# Patient Record
Sex: Female | Born: 1970
Health system: Southern US, Community
[De-identification: ages and names within clinical notes are randomized; demographics above are authoritative.]

## PROBLEM LIST (undated history)

## (undated) DIAGNOSIS — D649 Anemia, unspecified: Secondary | ICD-10-CM

## (undated) DIAGNOSIS — D582 Other hemoglobinopathies: Secondary | ICD-10-CM

## (undated) DIAGNOSIS — R768 Other specified abnormal immunological findings in serum: Secondary | ICD-10-CM

## (undated) HISTORY — DX: Other specified abnormal immunological findings in serum: R76.8

## (undated) HISTORY — PX: BREAST CYST ASPIRATION: SHX578

## (undated) HISTORY — PX: NASAL ENDOSCOPY: SHX286

## (undated) HISTORY — DX: Other hemoglobinopathies: D58.2

## (undated) HISTORY — DX: Anemia, unspecified: D64.9

---

## 2014-05-06 ENCOUNTER — Ambulatory Visit: Payer: Self-pay | Admitting: General Practice

## 2014-12-24 ENCOUNTER — Encounter: Payer: Self-pay | Admitting: Family Medicine

## 2014-12-24 ENCOUNTER — Ambulatory Visit (INDEPENDENT_AMBULATORY_CARE_PROVIDER_SITE_OTHER): Payer: 59 | Admitting: Family Medicine

## 2014-12-24 ENCOUNTER — Telehealth: Payer: Self-pay | Admitting: Family Medicine

## 2014-12-24 VITALS — BP 112/66 | HR 99 | Temp 97.7°F | Resp 16 | Ht 61.0 in | Wt 126.9 lb

## 2014-12-24 DIAGNOSIS — Z136 Encounter for screening for cardiovascular disorders: Secondary | ICD-10-CM

## 2014-12-24 DIAGNOSIS — Z124 Encounter for screening for malignant neoplasm of cervix: Secondary | ICD-10-CM | POA: Insufficient documentation

## 2014-12-24 DIAGNOSIS — N644 Mastodynia: Secondary | ICD-10-CM | POA: Insufficient documentation

## 2014-12-24 DIAGNOSIS — IMO0001 Reserved for inherently not codable concepts without codable children: Secondary | ICD-10-CM

## 2014-12-24 DIAGNOSIS — Z8719 Personal history of other diseases of the digestive system: Secondary | ICD-10-CM | POA: Insufficient documentation

## 2014-12-24 DIAGNOSIS — R5383 Other fatigue: Secondary | ICD-10-CM | POA: Diagnosis not present

## 2014-12-24 DIAGNOSIS — Z01419 Encounter for gynecological examination (general) (routine) without abnormal findings: Secondary | ICD-10-CM | POA: Insufficient documentation

## 2014-12-24 DIAGNOSIS — Z113 Encounter for screening for infections with a predominantly sexual mode of transmission: Secondary | ICD-10-CM | POA: Diagnosis not present

## 2014-12-24 DIAGNOSIS — Z23 Encounter for immunization: Secondary | ICD-10-CM | POA: Insufficient documentation

## 2014-12-24 DIAGNOSIS — Z13220 Encounter for screening for lipoid disorders: Secondary | ICD-10-CM | POA: Diagnosis not present

## 2014-12-24 DIAGNOSIS — Z0000 Encounter for general adult medical examination without abnormal findings: Secondary | ICD-10-CM

## 2014-12-24 DIAGNOSIS — Z1239 Encounter for other screening for malignant neoplasm of breast: Secondary | ICD-10-CM | POA: Insufficient documentation

## 2014-12-24 DIAGNOSIS — Z8768 Personal history of other (corrected) conditions arising in the perinatal period: Secondary | ICD-10-CM | POA: Insufficient documentation

## 2014-12-24 DIAGNOSIS — M1990 Unspecified osteoarthritis, unspecified site: Secondary | ICD-10-CM | POA: Insufficient documentation

## 2014-12-24 DIAGNOSIS — Z8669 Personal history of other diseases of the nervous system and sense organs: Secondary | ICD-10-CM | POA: Insufficient documentation

## 2014-12-24 DIAGNOSIS — M79609 Pain in unspecified limb: Secondary | ICD-10-CM | POA: Insufficient documentation

## 2014-12-24 DIAGNOSIS — Z87898 Personal history of other specified conditions: Secondary | ICD-10-CM | POA: Insufficient documentation

## 2014-12-24 DIAGNOSIS — N503 Cyst of epididymis: Secondary | ICD-10-CM | POA: Insufficient documentation

## 2014-12-24 DIAGNOSIS — Z789 Other specified health status: Secondary | ICD-10-CM | POA: Insufficient documentation

## 2014-12-24 NOTE — Telephone Encounter (Signed)
Contacted the [facility] and they will fax the report to out office.

## 2014-12-24 NOTE — Progress Notes (Signed)
Name: Bailey Perez   MRN: [id number]    DOB: Feb 03, 1971   Date:12/24/2014       Progress Note  Subjective  Chief Complaint  Chief Complaint  Patient presents with  . Annual Exam  . Labs Only    HPI  Patient is here today for a Complete Female Physical Exam:  The patient has has no unusual complaints, occassionally feels fatigued. Overall feels healthy. Diet is well balanced. In general does exercise regularly. Sees dentist regularly and addresses vision concerns with ophthalmologist if applicable. In regards to sexual activity the patient is currently sexually active. Currently is concerned about exposure to any STDs. Menstrual history is regular periods every 29 days. Pap test done 12/23/2013 negative.   Last year she consulted with me regarding years of bilateral breast pain. She did some diagnostic breast studies January 2016 and cysts were identified however results are not found in EPIC or [employer]. She reports that the bilateral breast pain was worse Fall and Winter months last year but improved this year so she had not contacted me regarding the results of her breast studies. She has changed her diet around, drinking more herbal tea and taking herbal remedies which have helped.    Past Medical History  Diagnosis Date  . Allergy     Past Surgical History  Procedure Laterality Date  . Nasal endoscopy      Dr. Clyde Canterbury    History reviewed. No pertinent family history.  Social History   Social History  . Marital Status: Married    Spouse Name: N/A  . Number of Children: N/A  . Years of Education: N/A   Occupational History  . Not on file.   Social History Main Topics  . Smoking status: Never Smoker   . Smokeless tobacco: Not on file  . Alcohol Use: No  . Drug Use: No  . Sexual Activity: No   Other Topics Concern  . Not on file   Social History Narrative  . No narrative on file     Current outpatient prescriptions:  .  Echinacea 400 MG CAPS,  Take by mouth., Disp: , Rfl:   No Known Allergies  ROS  CONSTITUTIONAL: No significant weight changes, fever, chills, weakness or fatigue.  HEENT:  - Eyes: No visual changes.  - Ears: No auditory changes. No pain.  - Nose: No sneezing, congestion, runny nose. - Throat: No sore throat. No changes in swallowing. SKIN: No rash or itching.  CARDIOVASCULAR: No chest pain, chest pressure or chest discomfort. No palpitations or edema.  RESPIRATORY: No shortness of breath, cough or sputum.  GASTROINTESTINAL: No anorexia, nausea, vomiting. No changes in bowel habits. No abdominal pain or blood.  GENITOURINARY: No dysuria. No frequency. No discharge. Yes breast pain both sides intermittently. NEUROLOGICAL: No headache, dizziness, syncope, paralysis, ataxia, numbness or tingling in the extremities. No memory changes. No change in bowel or bladder control.  MUSCULOSKELETAL: No joint pain. No muscle pain. HEMATOLOGIC: No anemia, bleeding or bruising.  LYMPHATICS: No enlarged lymph nodes.  PSYCHIATRIC: No change in mood. No change in sleep pattern.  ENDOCRINOLOGIC: No reports of sweating, cold or heat intolerance. No polyuria or polydipsia.   Objective  Filed Vitals:   12/24/14 1435  BP: 112/66  Pulse: 99  Temp: 97.7 F (36.5 C)  TempSrc: Oral  Resp: 16  Height: 5\' 1"  (1.549 m)  Weight: 126 lb 14.4 oz (57.561 kg)  SpO2: 98%   Body mass index is 23.99 kg/(m^2).  Depression screen PHQ 2/9 12/24/2014  Decreased Interest 0  Down, Depressed, Hopeless 0  PHQ - 2 Score 0     Physical Exam  Constitutional: Patient appears well-developed and well-nourished. In no distress.  HEENT:  - Head: Normocephalic and atraumatic.  - Ears: Bilateral TMs gray, no erythema or effusion - Nose: Nasal mucosa moist - Mouth/Throat: Oropharynx is clear and moist. No tonsillar hypertrophy or erythema. No post nasal drainage.  - Eyes: Conjunctivae clear, EOM movements normal. PERRLA. No scleral icterus.   Neck: Normal range of motion. Neck supple. No JVD present. No thyromegaly present.  Cardiovascular: Normal rate, regular rhythm and normal heart sounds.  No murmur heard.  Pulmonary/Chest: Effort normal and breath sounds normal. No respiratory distress. Abdominal: Soft. Bowel sounds are normal, no distension. There is no tenderness. no masses BREAST: Bilateral breast exam with no skin changes or nipple discharge. Palpable mass border of nipple right breast at the 9 o'clock position.  FEMALE GENITALIA: Deffered Musculoskeletal: Normal range of motion bilateral UE and LE, no joint effusions. Peripheral vascular: Bilateral LE no edema. Neurological: CN II-XII grossly intact with no focal deficits. Alert and oriented to person, place, and time. Coordination, balance, strength, speech and gait are normal.  Skin: Skin is warm and dry. No rash noted. No erythema.  Psychiatric: Patient has a normal mood and affect. Behavior is normal in office today. Judgment and thought content normal in office today.   Assessment & Plan  1. Annual physical exam Healthy female. Will be getting flu shot through work.  2. Breast pain in female We will be reaching out to the breast center to get her imaging records from earlier this year and repeat imaging accordingly.  - CBC with Differential/Platelet - Comprehensive metabolic panel - TSH  3. Encounter for cholesteral screening for cardiovascular disease  - Lipid panel  4. Other fatigue  - CBC with Differential/Platelet - Comprehensive metabolic panel - TSH  5. Screening for STD (sexually transmitted disease)  - Urine GC/Chlamydia/Trich

## 2014-12-24 NOTE — Telephone Encounter (Signed)
Please contact [facility] for breast imaging results done 04/2014 and get me a copy to review so I can order further testing.

## 2014-12-25 ENCOUNTER — Telehealth: Payer: Self-pay | Admitting: Family Medicine

## 2014-12-25 ENCOUNTER — Other Ambulatory Visit: Payer: Self-pay | Admitting: Family Medicine

## 2014-12-25 NOTE — Telephone Encounter (Signed)
I tried to contact this patient to review the results from her mammogram, but there was no answer. A message was left for the patient to give [place] a call when she got the chance. '

## 2014-12-25 NOTE — Telephone Encounter (Signed)
Please let patient know that I got a copy of her mammogram and ultrasound from 05/06/14 and the findings show:  simple appearing cyst in the right breast 10 o'clock location, no evidence for malignancy in either breast.  Once a year mammogram is all that is needed right now, please have her call us after 05/07/15 to remind [place] to order there mammogram. Thank you.

## 2014-12-26 LAB — COMPREHENSIVE METABOLIC PANEL
ALBUMIN: 4.4 g/dL (ref 3.5–5.5)
ALK PHOS: 60 IU/L (ref 39–117)
ALT: 18 IU/L (ref 0–32)
AST: 19 IU/L (ref 0–40)
Albumin/Globulin Ratio: 1.6 (ref 1.1–2.5)
BUN / CREAT RATIO: 16 (ref 9–23)
BUN: 12 mg/dL (ref 6–24)
Bilirubin Total: 0.6 mg/dL (ref 0.0–1.2)
CHLORIDE: 100 mmol/L (ref 97–108)
CO2: 20 mmol/L (ref 18–29)
CREATININE: 0.73 mg/dL (ref 0.57–1.00)
Calcium: 9.5 mg/dL (ref 8.7–10.2)
GFR calc Af Amer: 117 mL/min/{1.73_m2} (ref >59)
GFR calc non Af Amer: 101 mL/min/{1.73_m2} (ref >59)
GLUCOSE: 85 mg/dL (ref 65–99)
Globulin, Total: 2.8 g/dL (ref 1.5–4.5)
Potassium: 4.8 mmol/L (ref 3.5–5.2)
Sodium: 141 mmol/L (ref 134–144)
TOTAL PROTEIN: 7.2 g/dL (ref 6.0–8.5)

## 2014-12-26 LAB — CBC WITH DIFFERENTIAL/PLATELET
BASOS ABS: 0 10*3/uL (ref 0.0–0.2)
Basos: 1 %
EOS (ABSOLUTE): 0.1 10*3/uL (ref 0.0–0.4)
Eos: 2 %
HEMOGLOBIN: 13 g/dL (ref 11.1–15.9)
Hematocrit: 39.6 % (ref 34.0–46.6)
IMMATURE GRANS (ABS): 0 10*3/uL (ref 0.0–0.1)
Immature Granulocytes: 0 %
LYMPHS: 34 %
Lymphocytes Absolute: 1.9 10*3/uL (ref 0.7–3.1)
MCH: 25.1 pg — ABNORMAL LOW (ref 26.6–33.0)
MCHC: 32.8 g/dL (ref 31.5–35.7)
MCV: 76 fL — ABNORMAL LOW (ref 79–97)
MONOCYTES: 8 %
Monocytes Absolute: 0.5 10*3/uL (ref 0.1–0.9)
NEUTROS PCT: 55 %
Neutrophils Absolute: 2.9 10*3/uL (ref 1.4–7.0)
PLATELETS: 384 10*3/uL — AB (ref 150–379)
RBC: 5.18 x10E6/uL (ref 3.77–5.28)
RDW: 15.3 % (ref 12.3–15.4)
WBC: 5.4 10*3/uL (ref 3.4–10.8)

## 2014-12-26 LAB — LIPID PANEL
CHOLESTEROL TOTAL: 193 mg/dL (ref 100–199)
Chol/HDL Ratio: 2.9 ratio units (ref 0.0–4.4)
HDL: 67 mg/dL (ref >39)
LDL CALC: 114 mg/dL — AB (ref 0–99)
Triglycerides: 59 mg/dL (ref 0–149)
VLDL CHOLESTEROL CAL: 12 mg/dL (ref 5–40)

## 2014-12-26 LAB — TSH: TSH: 2.64 u[IU]/mL (ref 0.450–4.500)

## 2014-12-30 LAB — CHLAMYDIA/GONOCOCCUS/TRICHOMONAS, NAA
Chlamydia by NAA: NEGATIVE
GONOCOCCUS BY NAA: NEGATIVE
Trich vag by NAA: NEGATIVE

## 2015-01-15 ENCOUNTER — Telehealth: Payer: Self-pay | Admitting: Family Medicine

## 2015-01-15 NOTE — Telephone Encounter (Signed)
Pt is requesting for her mammogram to be scheduled for sometime in January.

## 2015-01-18 ENCOUNTER — Other Ambulatory Visit: Payer: Self-pay | Admitting: Family Medicine

## 2015-01-18 DIAGNOSIS — N644 Mastodynia: Secondary | ICD-10-CM

## 2015-01-18 DIAGNOSIS — Z1231 Encounter for screening mammogram for malignant neoplasm of breast: Secondary | ICD-10-CM

## 2015-01-18 NOTE — Telephone Encounter (Signed)
Patient was informed of Dr. Allie Dimmer message.

## 2015-01-18 NOTE — Telephone Encounter (Signed)
Routine mammogram ordered for after 05/10/15, please intstruct patient to call [facility] after 05/10/15 and schedule at her convenience. Relevant history:  Mammogram and ultrasound from 05/06/14 showed  simple appearing cyst in the right breast 10 o'clock location, no evidence for malignancy in either breast.  Once a year mammogram is all that is needed right now.

## 2015-10-13 DIAGNOSIS — H5213 Myopia, bilateral: Secondary | ICD-10-CM | POA: Diagnosis not present

## 2016-01-17 ENCOUNTER — Encounter: Payer: Self-pay | Admitting: Family Medicine

## 2016-01-17 ENCOUNTER — Ambulatory Visit (INDEPENDENT_AMBULATORY_CARE_PROVIDER_SITE_OTHER): Payer: 59 | Admitting: Family Medicine

## 2016-01-17 VITALS — BP 114/72 | HR 81 | Temp 97.6°F | Resp 14 | Wt 123.0 lb

## 2016-01-17 DIAGNOSIS — R0789 Other chest pain: Secondary | ICD-10-CM | POA: Insufficient documentation

## 2016-01-17 DIAGNOSIS — Z114 Encounter for screening for human immunodeficiency virus [HIV]: Secondary | ICD-10-CM

## 2016-01-17 DIAGNOSIS — Z0000 Encounter for general adult medical examination without abnormal findings: Secondary | ICD-10-CM

## 2016-01-17 LAB — LIPID PANEL
CHOLESTEROL: 158 mg/dL (ref 125–200)
HDL: 54 mg/dL (ref >=46)
LDL CALC: 90 mg/dL (ref <130)
TRIGLYCERIDES: 72 mg/dL (ref <150)
Total CHOL/HDL Ratio: 2.9 Ratio (ref <=5.0)
VLDL: 14 mg/dL (ref <30)

## 2016-01-17 LAB — CBC WITH DIFFERENTIAL/PLATELET
BASOS PCT: 0 %
Basophils Absolute: 0 cells/uL (ref 0–200)
EOS PCT: 2 %
Eosinophils Absolute: 102 cells/uL (ref 15–500)
HEMATOCRIT: 38.6 % (ref 35.0–45.0)
Hemoglobin: 12.8 g/dL (ref 11.7–15.5)
LYMPHS ABS: 1683 {cells}/uL (ref 850–3900)
LYMPHS PCT: 33 %
MCH: 24.8 pg — ABNORMAL LOW (ref 27.0–33.0)
MCHC: 33.2 g/dL (ref 32.0–36.0)
MCV: 74.8 fL — ABNORMAL LOW (ref 80.0–100.0)
MONO ABS: 459 {cells}/uL (ref 200–950)
MPV: 11.6 fL (ref 7.5–12.5)
Monocytes Relative: 9 %
Neutro Abs: 2856 cells/uL (ref 1500–7800)
Neutrophils Relative %: 56 %
PLATELETS: 409 10*3/uL — AB (ref 140–400)
RBC: 5.16 MIL/uL — AB (ref 3.80–5.10)
RDW: 15 % (ref 11.0–15.0)
WBC: 5.1 10*3/uL (ref 3.8–10.8)

## 2016-01-17 LAB — COMPLETE METABOLIC PANEL WITH GFR
ALBUMIN: 4.4 g/dL (ref 3.6–5.1)
ALK PHOS: 44 U/L (ref 33–115)
ALT: 13 U/L (ref 6–29)
AST: 17 U/L (ref 10–30)
BILIRUBIN TOTAL: 0.8 mg/dL (ref 0.2–1.2)
BUN: 9 mg/dL (ref 7–25)
CALCIUM: 9.5 mg/dL (ref 8.6–10.2)
CO2: 26 mmol/L (ref 20–31)
CREATININE: 0.7 mg/dL (ref 0.50–1.10)
Chloride: 105 mmol/L (ref 98–110)
Glucose, Bld: 92 mg/dL (ref 65–99)
Potassium: 4.4 mmol/L (ref 3.5–5.3)
Sodium: 138 mmol/L (ref 135–146)
TOTAL PROTEIN: 7.7 g/dL (ref 6.1–8.1)

## 2016-01-17 NOTE — Progress Notes (Signed)
Patient ID: Bailey Perez, female   DOB: 03-Mar-1971, 45 y.o.   MRN: [id number]   Subjective:   Bailey Perez is a 45 y.o. female here for a complete physical exam  Interim issues since last visit:  USPSTF grade A and B recommendations Alcohol: no Depression:  Depression screen [facility] 2/9 01/17/2016 12/24/2014  Decreased Interest 0 0  Down, Depressed, Hopeless 0 0  PHQ - 2 Score 0 0  Hypertension: no Obesity: no Tobacco use: never smoker HIV, hep B, hep C: order HIV STD testing and prevention (chl/gon/syphilis): declined Lipids: today Glucose: today Colorectal cancer: no fam hx Breast cancer: paternal aunt BRCA gene screening: no ovarian cancer Intimate partner violence: no Cervical cancer screening: UTD, last pap 2015; no hx of abnormal Lung cancer: n/a Osteoporosis: n/a Fall prevention/vitamin D: not taking AAA: n/a Aspirin: PRN Diet: not a good eater; tries to avoid soda Exercise: no regular, just walking up and down stairs Skin cancer: no  Left wrist hurts; off an on, mostly at night-time; laying down it is constant pain on the wrist; wearing copper glove and it helps; just started recently; starting to get better; arm better  Chest tightness with left arm pain after eating hamburgers, anything that's heavy, anything that "body doesn't digest quickly"; going on for two years; drinks green tea; no fam hx of coronary artery disease; individual episodes lasts 10-20 minutes; taking an aspirin makes it go away; feels uncomfortable; probably gets nauseated; does get [facility]  Past Medical History:  Diagnosis Date  . Allergy    Past Surgical History:  Procedure Laterality Date  . NASAL ENDOSCOPY     Dr. Clyde Canterbury   Family History  Problem Relation Age of Onset  . Cancer Paternal Aunt     breast    MD NOTE: both parents still alive; patient not sure of chronic illness  Social History  Substance Use Topics  . Smoking status: Never Smoker  . Smokeless  tobacco: Never Used  . Alcohol use No   Review of Systems  Objective:   Vitals:   01/17/16 0831  BP: 114/72  Pulse: 81  Resp: 14  Temp: 97.6 F (36.4 C)  TempSrc: Oral  SpO2: 92%  Weight: 123 lb (55.8 kg)   Body mass index is 23.24 kg/m. Wt Readings from Last 3 Encounters:  01/17/16 123 lb (55.8 kg)  12/24/14 126 lb 14.4 oz (57.6 kg)   Physical Exam  Constitutional: She appears well-developed and well-nourished.  HENT:  Head: Normocephalic and atraumatic.  Right Ear: Hearing, tympanic membrane, external ear and ear canal normal.  Left Ear: Hearing, tympanic membrane, external ear and ear canal normal.  Eyes: Conjunctivae and EOM are normal. Right eye exhibits no hordeolum. Left eye exhibits no hordeolum. No scleral icterus.  Neck: Carotid bruit is not present. No thyromegaly present.  Cardiovascular: Normal rate, regular rhythm, S1 normal, S2 normal and normal heart sounds.   No extrasystoles are present.  Pulmonary/Chest: Effort normal and breath sounds normal. No respiratory distress. Right breast exhibits no inverted nipple, no mass, no nipple discharge, no skin change and no tenderness. Left breast exhibits no inverted nipple, no mass, no nipple discharge, no skin change and no tenderness. Breasts are symmetrical.  Abdominal: Soft. Normal appearance and bowel sounds are normal. She exhibits no distension, no abdominal bruit, no pulsatile midline mass and no mass. There is no hepatosplenomegaly. There is no tenderness. No hernia.  Musculoskeletal: Normal range of motion. She exhibits no edema.  Lymphadenopathy:       Head (right side): No submandibular adenopathy present.       Head (left side): No submandibular adenopathy present.    She has no cervical adenopathy.    She has no axillary adenopathy.  Neurological: She is alert. She displays no tremor. No cranial nerve deficit. She exhibits normal muscle tone. Gait normal.  Reflex Scores:      Patellar reflexes are 2+ on  the right side and 2+ on the left side. Skin: Skin is warm and dry. No bruising and no ecchymosis noted. No cyanosis. No pallor.  Psychiatric: Her speech is normal and behavior is normal. Thought content normal. Her mood appears not anxious. She does not exhibit a depressed mood.    Assessment/Plan:   Problem List Items Addressed This Visit      Other   Screening for HIV (human immunodeficiency virus)    Discussed one-time HIV screening recommendation per USPSTF guidelines; patient agrees with testing; HIV antibody ordered      Relevant Orders   HIV antibody (Completed)   Chest tightness    Refer to cardiologist because of her report that it goes away with aspirin; likely reflux though, but r/o CAD; avoid foods that cause that trigger; likely heartburn or reflux; start OTC Zantac 150 mg BID; refer to cardiology for work-up; to ER if recurs, serious      Relevant Orders   Ambulatory referral to Cardiology   Annual physical exam    USPSTF grade A and B recommendations reviewed with patient; age-appropriate recommendations, preventive care, screening tests, etc discussed and encouraged; healthy living encouraged; see AVS for patient education given to patient       Relevant Orders   CBC with Differential/Platelet (Completed)   Lipid panel (Completed)   COMPLETE METABOLIC PANEL WITH GFR (Completed)    Other Visit Diagnoses   None.     Orders Placed This Encounter  Procedures  . CBC with Differential/Platelet  . Lipid panel  . COMPLETE METABOLIC PANEL WITH GFR  . HIV antibody  . Ambulatory referral to Cardiology    Referral Priority:   Routine    Referral Type:   Consultation    Referral Reason:   Specialty Services Required    Requested Specialty:   Cardiology    Number of Visits Requested:   1    Follow up plan: Return in about 1 year (around 01/16/2017) for complete physical.  An After Visit Summary was printed and given to the patient.  Patient brought up wrist  pain, then brought up shoulder pain (right) as she raised the right arm for breast exam; she was invited to return for a problem-based visit for those issues

## 2016-01-17 NOTE — Assessment & Plan Note (Signed)
USPSTF grade A and B recommendations reviewed with patient; age-appropriate recommendations, preventive care, screening tests, etc discussed and encouraged; healthy living encouraged; see AVS for patient education given to patient  

## 2016-01-17 NOTE — Patient Instructions (Addendum)
Do not start an exercise program until cleared by cardiologist Avoid trigger foods like meats Try zantac 150 mg twice a day if needed We'll get labs today We'll refer you to a cardiologist If you have not heard anything from my staff in a week about any orders/referrals/studies from today, please contact us here to follow-up [phone] Health Maintenance, Female Adopting a healthy lifestyle and getting preventive care can go a long way to promote health and wellness. Talk with your health care provider about what schedule of regular examinations is right for you. This is a good chance for you to check in with your provider about disease prevention and staying healthy. In between checkups, there are plenty of things you can do on your own. Experts have done a lot of research about which lifestyle changes and preventive measures are most likely to keep you healthy. Ask your health care provider for more information. WEIGHT AND DIET  Eat a healthy diet  Be sure to include plenty of vegetables, fruits, low-fat dairy products, and lean protein.  Do not eat a lot of foods high in solid fats, added sugars, or salt.  Get regular exercise. This is one of the most important things you can do for your health.  Most adults should exercise for at least 150 minutes each week. The exercise should increase your heart rate and make you sweat (moderate-intensity exercise).  Most adults should also do strengthening exercises at least twice a week. This is in addition to the moderate-intensity exercise.  Maintain a healthy weight  Body mass index (BMI) is a measurement that can be used to identify possible weight problems. It estimates body fat based on height and weight. Your health care provider can help determine your BMI and help you achieve or maintain a healthy weight.  For females 5 years of age and older:   A BMI below 18.5 is considered underweight.  A BMI of 18.5 to 24.9 is normal.  A BMI  of 25 to 29.9 is considered overweight.  A BMI of 30 and above is considered obese.  Watch levels of cholesterol and blood lipids  You should start having your blood tested for lipids and cholesterol at 44 years of age, then have this test every 5 years.  You may need to have your cholesterol levels checked more often if:  Your lipid or cholesterol levels are high.  You are older than 44 years of age.  You are at high risk for heart disease.  CANCER SCREENING   Lung Cancer  Lung cancer screening is recommended for adults 80-86 years old who are at high risk for lung cancer because of a history of smoking.  A yearly low-dose CT scan of the lungs is recommended for people who:  Currently smoke.  Have quit within the past 15 years.  Have at least a 30-pack-year history of smoking. A pack year is smoking an average of one pack of cigarettes a day for 1 year.  Yearly screening should continue until it has been 15 years since you quit.  Yearly screening should stop if you develop a health problem that would prevent you from having lung cancer treatment.  Breast Cancer  Practice breast self-awareness. This means understanding how your breasts normally appear and feel.  It also means doing regular breast self-exams. Let your health care provider know about any changes, no matter how small.  If you are in your 20s or 30s, you should have a clinical breast exam (  CBE) by a health care provider every 1-3 years as part of a regular health exam.  If you are 40 or older, have a CBE every year. Also consider having a breast X-ray (mammogram) every year.  If you have a family history of breast cancer, talk to your health care provider about genetic screening.  If you are at high risk for breast cancer, talk to your health care provider about having an MRI and a mammogram every year.  Breast cancer gene (BRCA) assessment is recommended for women who have family members with  BRCA-related cancers. BRCA-related cancers include:  Breast.  Ovarian.  Tubal.  Peritoneal cancers.  Results of the assessment will determine the need for genetic counseling and BRCA1 and BRCA2 testing. Cervical Cancer Your health care provider may recommend that you be screened regularly for cancer of the pelvic organs (ovaries, uterus, and vagina). This screening involves a pelvic examination, including checking for microscopic changes to the surface of your cervix (Pap test). You may be encouraged to have this screening done every 3 years, beginning at age 57.  For women ages 14-65, health care providers may recommend pelvic exams and Pap testing every 3 years, or they may recommend the Pap and pelvic exam, combined with testing for human papilloma virus (HPV), every 5 years. Some types of HPV increase your risk of cervical cancer. Testing for HPV may also be done on women of any age with unclear Pap test results.  Other health care providers may not recommend any screening for nonpregnant women who are considered low risk for pelvic cancer and who do not have symptoms. Ask your health care provider if a screening pelvic exam is right for you.  If you have had past treatment for cervical cancer or a condition that could lead to cancer, you need Pap tests and screening for cancer for at least 20 years after your treatment. If Pap tests have been discontinued, your risk factors (such as having a new sexual partner) need to be reassessed to determine if screening should resume. Some women have medical problems that increase the chance of getting cervical cancer. In these cases, your health care provider may recommend more frequent screening and Pap tests. Colorectal Cancer  This type of cancer can be detected and often prevented.  Routine colorectal cancer screening usually begins at 44 years of age and continues through 44 years of age.  Your health care provider may recommend screening at  an earlier age if you have risk factors for colon cancer.  Your health care provider may also recommend using home test kits to check for hidden blood in the stool.  A small camera at the end of a tube can be used to examine your colon directly (sigmoidoscopy or colonoscopy). This is done to check for the earliest forms of colorectal cancer.  Routine screening usually begins at age 33.  Direct examination of the colon should be repeated every 5-10 years through 44 years of age. However, you may need to be screened more often if early forms of precancerous polyps or small growths are found. Skin Cancer  Check your skin from head to toe regularly.  Tell your health care provider about any new moles or changes in moles, especially if there is a change in a mole's shape or color.  Also tell your health care provider if you have a mole that is larger than the size of a pencil eraser.  Always use sunscreen. Apply sunscreen liberally and repeatedly throughout  the day.  Protect yourself by wearing long sleeves, pants, a wide-brimmed hat, and sunglasses whenever you are outside. HEART DISEASE, DIABETES, AND HIGH BLOOD PRESSURE   High blood pressure causes heart disease and increases the risk of stroke. High blood pressure is more likely to develop in:  People who have blood pressure in the high end of the normal range (130-139/85-89 mm Hg).  People who are overweight or obese.  People who are African American.  If you are 39-38 years of age, have your blood pressure checked every 3-5 years. If you are 5 years of age or older, have your blood pressure checked every year. You should have your blood pressure measured twice--once when you are at a hospital or clinic, and once when you are not at a hospital or clinic. Record the average of the two measurements. To check your blood pressure when you are not at a hospital or clinic, you can use:  An automated blood pressure machine at a  pharmacy.  A home blood pressure monitor.  If you are between 58 years and 52 years old, ask your health care provider if you should take aspirin to prevent strokes.  Have regular diabetes screenings. This involves taking a blood sample to check your fasting blood sugar level.  If you are at a normal weight and have a low risk for diabetes, have this test once every three years after 44 years of age.  If you are overweight and have a high risk for diabetes, consider being tested at a younger age or more often. PREVENTING INFECTION  Hepatitis B  If you have a higher risk for hepatitis B, you should be screened for this virus. You are considered at high risk for hepatitis B if:  You were born in a country where hepatitis B is common. Ask your health care provider which countries are considered high risk.  Your parents were born in a high-risk country, and you have not been immunized against hepatitis B (hepatitis B vaccine).  You have HIV or AIDS.  You use needles to inject street drugs.  You live with someone who has hepatitis B.  You have had sex with someone who has hepatitis B.  You get hemodialysis treatment.  You take certain medicines for conditions, including cancer, organ transplantation, and autoimmune conditions. Hepatitis C  Blood testing is recommended for:  Everyone born from [birth year] through 1964.  Anyone with known risk factors for hepatitis C. Sexually transmitted infections (STIs)  You should be screened for sexually transmitted infections (STIs) including gonorrhea and chlamydia if:  You are sexually active and are younger than 44 years of age.  You are older than 44 years of age and your health care provider tells you that you are at risk for this type of infection.  Your sexual activity has changed since you were last screened and you are at an increased risk for chlamydia or gonorrhea. Ask your health care provider if you are at risk.  If you do not  have HIV, but are at risk, it may be recommended that you take a prescription medicine daily to prevent HIV infection. This is called pre-exposure prophylaxis (PrEP). You are considered at risk if:  You are sexually active and do not regularly use condoms or know the HIV status of your partner(s).  You take drugs by injection.  You are sexually active with a partner who has HIV. Talk with your health care provider about whether you are at high risk  of being infected with HIV. If you choose to begin PrEP, you should first be tested for HIV. You should then be tested every 3 months for as long as you are taking PrEP.  PREGNANCY   If you are premenopausal and you may become pregnant, ask your health care provider about preconception counseling.  If you may become pregnant, take 400 to 800 micrograms (mcg) of folic acid every day.  If you want to prevent pregnancy, talk to your health care provider about birth control (contraception). OSTEOPOROSIS AND MENOPAUSE   Osteoporosis is a disease in which the bones lose minerals and strength with aging. This can result in serious bone fractures. Your risk for osteoporosis can be identified using a bone density scan.  If you are 27 years of age or older, or if you are at risk for osteoporosis and fractures, ask your health care provider if you should be screened.  Ask your health care provider whether you should take a calcium or vitamin D supplement to lower your risk for osteoporosis.  Menopause may have certain physical symptoms and risks.  Hormone replacement therapy may reduce some of these symptoms and risks. Talk to your health care provider about whether hormone replacement therapy is right for you.  HOME CARE INSTRUCTIONS   Schedule regular health, dental, and eye exams.  Stay current with your immunizations.   Do not use any tobacco products including cigarettes, chewing tobacco, or electronic cigarettes.  If you are pregnant, do not  drink alcohol.  If you are breastfeeding, limit how much and how often you drink alcohol.  Limit alcohol intake to no more than 1 drink per day for nonpregnant women. One drink equals 12 ounces of beer, 5 ounces of wine, or 1 ounces of hard liquor.  Do not use street drugs.  Do not share needles.  Ask your health care provider for help if you need support or information about quitting drugs.  Tell your health care provider if you often feel depressed.  Tell your health care provider if you have ever been abused or do not feel safe at home.   This information is not intended to replace advice given to you by your health care provider. Make sure you discuss any questions you have with your health care provider.   Document Released: 10/24/2010 Document Revised: 05/01/2014 Document Reviewed: 03/12/2013 Elsevier Interactive Patient Education [employer].

## 2016-01-17 NOTE — Assessment & Plan Note (Addendum)
Discussed one-time HIV screening recommendation per USPSTF guidelines; patient agrees with testing; HIV antibody ordered 

## 2016-01-17 NOTE — Assessment & Plan Note (Addendum)
Refer to cardiologist because of her report that it goes away with aspirin; likely reflux though, but r/o CAD; avoid foods that cause that trigger; likely heartburn or reflux; start OTC Zantac 150 mg BID; refer to cardiology for work-up; to ER if recurs, serious

## 2016-01-18 ENCOUNTER — Other Ambulatory Visit: Payer: Self-pay | Admitting: Family Medicine

## 2016-01-18 DIAGNOSIS — R718 Other abnormality of red blood cells: Secondary | ICD-10-CM

## 2016-01-18 DIAGNOSIS — D75839 Thrombocytosis, unspecified: Secondary | ICD-10-CM

## 2016-01-18 DIAGNOSIS — D473 Essential (hemorrhagic) thrombocythemia: Secondary | ICD-10-CM

## 2016-01-18 LAB — HIV ANTIBODY (ROUTINE TESTING W REFLEX): HIV: NONREACTIVE

## 2016-01-18 NOTE — Progress Notes (Signed)
New orders entered; pt to have labs, pick up stool cards, go to GI for consideration of colonoscopy and/or EGD

## 2016-01-19 ENCOUNTER — Other Ambulatory Visit: Payer: Self-pay | Admitting: Family Medicine

## 2016-01-19 DIAGNOSIS — R718 Other abnormality of red blood cells: Secondary | ICD-10-CM | POA: Diagnosis not present

## 2016-01-19 DIAGNOSIS — D473 Essential (hemorrhagic) thrombocythemia: Secondary | ICD-10-CM

## 2016-01-19 DIAGNOSIS — D75839 Thrombocytosis, unspecified: Secondary | ICD-10-CM

## 2016-01-20 LAB — CBC WITH DIFFERENTIAL/PLATELET
BASOS ABS: 0 {cells}/uL (ref 0–200)
Basophils Relative: 0 %
EOS ABS: 98 {cells}/uL (ref 15–500)
EOS PCT: 2 %
HCT: 39.8 % (ref 35.0–45.0)
Hemoglobin: 12.9 g/dL (ref 11.7–15.5)
LYMPHS PCT: 33 %
Lymphs Abs: 1617 cells/uL (ref 850–3900)
MCH: 24.9 pg — AB (ref 27.0–33.0)
MCHC: 32.4 g/dL (ref 32.0–36.0)
MCV: 76.7 fL — ABNORMAL LOW (ref 80.0–100.0)
MONOS PCT: 9 %
MPV: 12 fL (ref 7.5–12.5)
Monocytes Absolute: 441 cells/uL (ref 200–950)
NEUTROS ABS: 2744 {cells}/uL (ref 1500–7800)
Neutrophils Relative %: 56 %
PLATELETS: 409 10*3/uL — AB (ref 140–400)
RBC: 5.19 MIL/uL — ABNORMAL HIGH (ref 3.80–5.10)
RDW: 16 % — AB (ref 11.0–15.0)
WBC: 4.9 10*3/uL (ref 3.8–10.8)

## 2016-01-20 LAB — IRON,TIBC AND FERRITIN PANEL
%SAT: 36 % (ref 11–50)
FERRITIN: 153 ng/mL (ref 10–232)
Iron: 104 ug/dL (ref 40–190)
TIBC: 288 ug/dL (ref 250–450)

## 2016-01-20 LAB — PATHOLOGIST SMEAR REVIEW

## 2016-01-24 LAB — HEMOGLOBINOPATHY EVALUATION
HCT: 39.8 % (ref 35.0–45.0)
HEMOGLOBIN: 12.9 g/dL (ref 11.7–15.5)
HGB A2 QUANT: 3.8 % — AB (ref 1.8–3.5)
Hgb A: 72.1 % — ABNORMAL LOW (ref >96.0)
Hgb E Quant: 23.1 % — ABNORMAL HIGH
MCH: 24.9 pg — ABNORMAL LOW (ref 27.0–33.0)
MCV: 76.7 fL — ABNORMAL LOW (ref 80.0–100.0)
RBC: 5.19 MIL/uL — ABNORMAL HIGH (ref 3.80–5.10)
RDW: 16 % — ABNORMAL HIGH (ref 11.0–15.0)

## 2016-01-26 ENCOUNTER — Telehealth: Payer: Self-pay | Admitting: Family Medicine

## 2016-01-26 ENCOUNTER — Ambulatory Visit: Payer: Self-pay | Admitting: Cardiology

## 2016-01-26 DIAGNOSIS — Z1231 Encounter for screening mammogram for malignant neoplasm of breast: Secondary | ICD-10-CM | POA: Insufficient documentation

## 2016-01-26 NOTE — Assessment & Plan Note (Signed)
Order mammo 

## 2016-01-26 NOTE — Telephone Encounter (Signed)
She has hemoglobin E, inherited, common in [place] (where her family is from); not due to iron deficiency Will put in order for yearly screening mammogram Do keep appt w/cardiology

## 2016-02-02 ENCOUNTER — Encounter: Payer: Self-pay | Admitting: Internal Medicine

## 2016-02-02 ENCOUNTER — Ambulatory Visit (INDEPENDENT_AMBULATORY_CARE_PROVIDER_SITE_OTHER): Payer: 59 | Admitting: Internal Medicine

## 2016-02-02 VITALS — BP 96/68 | HR 74 | Ht 61.0 in | Wt 121.8 lb

## 2016-02-02 DIAGNOSIS — M2550 Pain in unspecified joint: Secondary | ICD-10-CM

## 2016-02-02 DIAGNOSIS — R079 Chest pain, unspecified: Secondary | ICD-10-CM

## 2016-02-02 NOTE — Patient Instructions (Signed)
Medication Instructions:  Your physician recommends that you continue on your current medications as directed. Please refer to the Current Medication list given to you today.   Labwork: none  Testing/Procedures: none  Follow-Up: Your physician recommends that you schedule a follow-up appointment as needed.    Any Other Special Instructions Will Be Listed Below (If Applicable).     If you need a refill on your cardiac medications before your next appointment, please call your pharmacy.   

## 2016-02-02 NOTE — Progress Notes (Signed)
New Outpatient Visit Date: 02/02/2016  Referring Provider: Arnetha Courser, MD [address] [place], [place] [address]  Chief Complaint: Chest pain  HPI:  Ms. Silverio is a 45 y.o. year-old female with no significant past medical history, who has been referred by Dr. Sanda Klein for evaluation of chest pain. Patient reports intermittent chest pain over the last year that she describes as a tightening in her chest and vague discomfort involving the left arm. The pain's maximal intensity is 8/10. It happens only about every 2-3 months and has not occurred for over a month. She notes associated mild shortness of breath but no other associated symptoms. She typically takes a dose of aspirin with resolution of the pain over the course of about 20-30 minutes. She walks regularly and occasionally exercises without any symptoms. The patient states that constipation seems to bring on the discomfort. She also has intermittent episodes of acid reflux with a burning in her throat and an acidic taste in her mouth. Since changing her diet and elevating the head of her bed, she notes improvement in her reflux symptoms. She does not routinely take any medication for this. She experiences rare palpitations, typically when she is anxious. She has noticed a few episodes of very brief lightheadedness that she describes as "just a flash." She has not passed out and does not recall any specific triggers.  The patient notes pain involving several joints including the knees, elbows, and fingers. The discomfort seems to migrate and is occasionally associated with mild swelling. She has been taking tumeric intermittently with some improvement. She is also been wearing copper gloves.  --------------------------------------------------------------------------------------------------  Cardiovascular History & Procedures: Cardiovascular Problems:  Atypical chest pain  Risk Factors:  None  Cath/PCI:  None  CV  Surgery:  None  EP Procedures and Devices:  None  Non-Invasive Evaluation(s):  None  Recent CV Pertinent Labs: Lab Results  Component Value Date   CHOL 158 01/17/2016   CHOL 193 12/25/2014   HDL 54 01/17/2016   HDL 67 12/25/2014   LDLCALC 90 01/17/2016   LDLCALC 114 (H) 12/25/2014   TRIG 72 01/17/2016   CHOLHDL 2.9 01/17/2016   K 4.4 01/17/2016   BUN 9 01/17/2016   BUN 12 12/25/2014   CREATININE 0.70 01/17/2016    --------------------------------------------------------------------------------------------------  Past Medical History:  Diagnosis Date  . Allergy     Past Surgical History:  Procedure Laterality Date  . NASAL ENDOSCOPY     Dr. Clyde Canterbury    Outpatient Encounter Prescriptions as of 02/02/2016  Medication Sig  . aspirin 81 MG tablet Take 81 mg by mouth daily.  . Echinacea 400 MG CAPS Take by mouth.   No facility-administered encounter medications on file as of 02/02/2016.     Allergies: Review of patient's allergies indicates no known allergies.  Social History   Social History  . Marital status: Married    Spouse name: N/A  . Number of children: N/A  . Years of education: N/A   Occupational History  . Not on file.   Social History Main Topics  . Smoking status: Never Smoker  . Smokeless tobacco: Never Used  . Alcohol use No  . Drug use: No  . Sexual activity: No   Other Topics Concern  . Not on file   Social History Narrative  . No narrative on file    Family History  Problem Relation Age of Onset  . Cancer Paternal Aunt     breast  Review of Systems: A 12-system review of systems was performed and was negative except as noted in the HPI.  --------------------------------------------------------------------------------------------------  Physical Exam: Ht 5\' 1"  (1.549 m)   Wt 121 lb 12 oz (55.2 kg)   LMP 12/28/2015   BMI 23.00 kg/m   General:  Well-developed, well-nourished woman HEENT: No conjunctival  pallor or scleral icterus.  Moist mucous membranes.  OP clear. Neck: Supple without lymphadenopathy, thyromegaly, JVD, or HJR.  No carotid bruit. Lungs: Normal work of breathing.  Clear to auscultation bilaterally without wheezes or crackles. Heart: Regular rate and rhythm without murmurs, rubs, or gallops.  Non-displaced PMI. Abd: Bowel sounds present.  Soft, NT/ND without hepatosplenomegaly Ext: No lower extremity edema.  Radial, PT, and DP pulses are 2+ bilaterally Skin: warm and dry without rash Neuro: CNIII-XII intact.  Strength and fine-touch sensation intact in upper and lower extremities bilaterally. Psych: Normal mood and affect.  EKG:  Normal sinus rhythm with nonspecific T-wave changes. No prior tracing available for comparison.  Lab Results  Component Value Date   WBC 4.9 01/19/2016   HGB 12.9 01/19/2016   HGB 12.9 01/19/2016   HCT 39.8 01/19/2016   HCT 39.8 01/19/2016   MCV 76.7 (L) 01/19/2016   MCV 76.7 (L) 01/19/2016   PLT 409 (H) 01/19/2016    Lab Results  Component Value Date   NA 138 01/17/2016   K 4.4 01/17/2016   CL 105 01/17/2016   CO2 26 01/17/2016   BUN 9 01/17/2016   CREATININE 0.70 01/17/2016   GLUCOSE 92 01/17/2016   ALT 13 01/17/2016    Lab Results  Component Value Date   CHOL 158 01/17/2016   HDL 54 01/17/2016   LDLCALC 90 01/17/2016   TRIG 72 01/17/2016   CHOLHDL 2.9 01/17/2016    --------------------------------------------------------------------------------------------------  ASSESSMENT AND PLAN: 1. Chest pain Pain is atypical with two distinct components. There is the infrequent pressure and vague left arm discomfort that comes on without clear precipitants and it resolves with aspirin. The patient also has symptoms consistent with GERD that have improved with dietary changes and elevation of the head of her bed. Overall, the patient is low risk for obstructive CAD, given her young age without significant coronary artery risk factors  and lack of exertional symptoms. Her EKG today demonstrates nonspecific T-wave changes. I suggested a noninvasive ischemia evaluation in light of her atypical symptoms with nonspecific EKG changes, but the patient declined. She wishes to contact us if her symptoms recur for further evaluation. I suggested that she could try taking an over-the-counter antacid such as famotidine or ranitidine to see if this helps improve the likely reflux component of her symptoms.   2. Polyarthralgia The patient has been expressing intermittent pains in multiple joints with subjective swelling. It is conceivable that a rheumatologic process may be at work, which can also cause intermittent chest pain. I suggested that the patient follow-up with her PCP for further evaluation.  Follow-up: Return to clinic as needed. Patient did not wish to schedule regular follow-up.  Nelva Bush, MD 02/02/2016 9:09 AM

## 2016-03-21 ENCOUNTER — Encounter: Payer: Self-pay | Admitting: Family Medicine

## 2016-09-07 ENCOUNTER — Ambulatory Visit (INDEPENDENT_AMBULATORY_CARE_PROVIDER_SITE_OTHER): Payer: 59 | Admitting: Family Medicine

## 2016-09-07 ENCOUNTER — Encounter: Payer: Self-pay | Admitting: Family Medicine

## 2016-09-07 ENCOUNTER — Encounter: Payer: Self-pay | Admitting: Physician Assistant

## 2016-09-07 ENCOUNTER — Ambulatory Visit: Payer: Self-pay | Admitting: Physician Assistant

## 2016-09-07 VITALS — BP 98/64 | HR 100 | Temp 98.5°F | Resp 14 | Wt 95.5 lb

## 2016-09-07 VITALS — BP 90/72 | HR 100 | Temp 98.6°F

## 2016-09-07 DIAGNOSIS — M25541 Pain in joints of right hand: Secondary | ICD-10-CM

## 2016-09-07 DIAGNOSIS — D582 Other hemoglobinopathies: Secondary | ICD-10-CM | POA: Diagnosis not present

## 2016-09-07 DIAGNOSIS — M25542 Pain in joints of left hand: Secondary | ICD-10-CM | POA: Diagnosis not present

## 2016-09-07 DIAGNOSIS — R634 Abnormal weight loss: Secondary | ICD-10-CM

## 2016-09-07 DIAGNOSIS — R1319 Other dysphagia: Secondary | ICD-10-CM

## 2016-09-07 DIAGNOSIS — Z1239 Encounter for other screening for malignant neoplasm of breast: Secondary | ICD-10-CM

## 2016-09-07 DIAGNOSIS — R1310 Dysphagia, unspecified: Secondary | ICD-10-CM | POA: Diagnosis not present

## 2016-09-07 DIAGNOSIS — G8929 Other chronic pain: Secondary | ICD-10-CM

## 2016-09-07 DIAGNOSIS — Z1231 Encounter for screening mammogram for malignant neoplasm of breast: Secondary | ICD-10-CM | POA: Diagnosis not present

## 2016-09-07 DIAGNOSIS — M2550 Pain in unspecified joint: Secondary | ICD-10-CM | POA: Insufficient documentation

## 2016-09-07 DIAGNOSIS — D489 Neoplasm of uncertain behavior, unspecified: Secondary | ICD-10-CM

## 2016-09-07 LAB — COMPREHENSIVE METABOLIC PANEL
ALBUMIN: 3.5 g/dL — AB (ref 3.6–5.1)
ALT: 5 U/L — ABNORMAL LOW (ref 6–29)
AST: 10 U/L (ref 10–35)
Alkaline Phosphatase: 60 U/L (ref 33–115)
BILIRUBIN TOTAL: 0.5 mg/dL (ref 0.2–1.2)
BUN: 6 mg/dL — ABNORMAL LOW (ref 7–25)
CALCIUM: 9.1 mg/dL (ref 8.6–10.2)
CHLORIDE: 99 mmol/L (ref 98–110)
CO2: 25 mmol/L (ref 20–31)
Creat: 0.57 mg/dL (ref 0.50–1.10)
Glucose, Bld: 77 mg/dL (ref 65–99)
Potassium: 4.4 mmol/L (ref 3.5–5.3)
Sodium: 137 mmol/L (ref 135–146)
TOTAL PROTEIN: 7.3 g/dL (ref 6.1–8.1)

## 2016-09-07 LAB — CBC WITH DIFFERENTIAL/PLATELET
Basophils Absolute: 0 cells/uL (ref 0–200)
Basophils Relative: 0 %
EOS ABS: 55 {cells}/uL (ref 15–500)
Eosinophils Relative: 1 %
HEMATOCRIT: 33.2 % — AB (ref 35.0–45.0)
Hemoglobin: 10.5 g/dL — ABNORMAL LOW (ref 11.7–15.5)
Lymphocytes Relative: 29 %
Lymphs Abs: 1595 cells/uL (ref 850–3900)
MCH: 23.1 pg — AB (ref 27.0–33.0)
MCHC: 31.6 g/dL — AB (ref 32.0–36.0)
MCV: 73.1 fL — ABNORMAL LOW (ref 80.0–100.0)
MONO ABS: 440 {cells}/uL (ref 200–950)
MONOS PCT: 8 %
MPV: 9.2 fL (ref 7.5–12.5)
NEUTROS ABS: 3410 {cells}/uL (ref 1500–7800)
Neutrophils Relative %: 62 %
Platelets: 661 10*3/uL — ABNORMAL HIGH (ref 140–400)
RBC: 4.54 MIL/uL (ref 3.80–5.10)
RDW: 14.9 % (ref 11.0–15.0)
WBC: 5.5 10*3/uL (ref 3.8–10.8)

## 2016-09-07 LAB — GLUCOSE, POCT (MANUAL RESULT ENTRY): POC Glucose: 81 mg/dl (ref 70–99)

## 2016-09-07 MED ORDER — MELOXICAM 15 MG PO TABS: 15.0000 mg | ORAL_TABLET | Freq: Every day | ORAL | 1 refills | Status: DC

## 2016-09-07 NOTE — Progress Notes (Signed)
S: c/o fingers/knees/hands/shoulders hurting, states she can't close her hands completely, has swelling in her knee and fingers, they look shiny, sx for "awhile" , hasn't seen her pcp in awhile, hasn't been taking any otc meds, no fever/chills/cp/sob; also states she has lost weight and her family is concerned  O: vitals wnl, nad, pt is very thin, skin on fingers is shiny, pt cannot close her hands completely, decreased rom of all fingers, swelling at knuckles, r knee with some swelling at joint line, no redness noted at knee, full rom, n/v intact, lungs c t a, cv rrr  A: joint pain, ?RA  P: mobic 15mg  qd, RMA made appointment with her pcp for her

## 2016-09-07 NOTE — Progress Notes (Signed)
Spoke with Suanne Marker @ [facility] scheduled patient appointment with Dr. Marzetta Board today @ 1:40p.m

## 2016-09-07 NOTE — Assessment & Plan Note (Addendum)
Check labs today, likely inflammatory process

## 2016-09-07 NOTE — Patient Instructions (Addendum)
Please return the stool cards as soon as possible Let's get the labs today We'll have you see the dermatologist as soon as possible We'll get the esophagus and stomach study Try to get adequate calories and use Ensure

## 2016-09-07 NOTE — Progress Notes (Signed)
BP 98/64   Pulse 100   Temp 98.5 F (36.9 C) (Oral)   Resp 14   Wt 95 lb 8 oz (43.3 kg)   LMP 08/23/2016   SpO2 97%   BMI 18.04 kg/m    Subjective:    Patient ID: Bailey Perez, female    DOB: 31-Dec-1970, 46 y.o.   MRN: [id number]  HPI: EMERALD SHOR is a 46 y.o. female  Chief Complaint  Patient presents with  . Joint Pain    knees and hands  . Weight Loss    20-30pds   HPI Patient is here for an acute visit She has lost a lot of weight She is not eating as much When she eats meat, it makes her chest hurt; every time she ate steak, her chest hurt; makes her body feels weird Can eat chicken and fish Does not feel like food gets stuck in her esophagus No blood in the stool Good appetite sometimes, sometimes eats a little bit and she's full No abdominal pain Had EGD done already in 2016 she says; she had symptoms from Thanksgiving to Feb and they checked and said inflammation No cough; never been a smoker Eats pistachios and she gets full "need more exercise" She saw the cardiologist; no chest pain now at all I asked patient if she is alarmed by her weight loss; no, she says She has altered her diet She has swelling and tightness in the fingers; skin is shiny She has joint pain in her wrists and fingers; hurts to turn keys, hard to use pressure Knees and feet also hurt No fam hx of GI malignancy or RA She forgot to do earlier stool cards she says No change in voice Had abnormal moles removed from abdomen, left hard scar, under arms too; cut like a spoon  Depression screen [facility] 2/9 09/07/2016 01/17/2016 12/24/2014  Decreased Interest 0 0 0  Down, Depressed, Hopeless 0 0 0  PHQ - 2 Score 0 0 0   Relevant past medical, surgical, family and social history reviewed Past Medical History:  Diagnosis Date  . Anemia    "hemoglobin E"  . 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 NASAL ENDOSCOPY     Dr.  Clyde Canterbury   Family History  Problem Relation Age of Onset  . Cancer Paternal Aunt        breast  . Heart disease Neg Hx    Social History   Social History  . Marital status: Married    Spouse name: N/A  . Number of children: N/A  . Years of education: N/A   Occupational History  . Not on file.   Social History Main Topics  . Smoking status: Never Smoker  . Smokeless tobacco: Never Used  . Alcohol use No  . Drug use: No  . Sexual activity: No   Other Topics Concern  . Not on file   Social History Narrative  . No narrative on file   Interim medical history since last visit reviewed. Allergies and medications reviewed  Review of Systems  Constitutional: Positive for fatigue (only if she misses breakfast) and unexpected weight change.  Respiratory: Negative for cough.   Gastrointestinal: Negative for abdominal pain and blood in stool.  Endocrine: Positive for cold intolerance and polyuria. Negative for polydipsia.  Genitourinary: Negative for hematuria.  Musculoskeletal: Positive for arthralgias and joint swelling.  Hematological: Negative for adenopathy. Does not bruise/bleed easily.  Per HPI unless specifically indicated above     Objective:    BP 98/64   Pulse 100   Temp 98.5 F (36.9 C) (Oral)   Resp 14   Wt 95 lb 8 oz (43.3 kg)   LMP 08/23/2016   SpO2 97%   BMI 18.04 kg/m   Wt Readings from Last 3 Encounters:  09/07/16 95 lb 8 oz (43.3 kg)  02/02/16 121 lb 12 oz (55.2 kg)  01/17/16 123 lb (55.8 kg)    Physical Exam  Constitutional: No distress.  Well-developed, thin middle-aged female; appears stated age; weight loss of 26 pounds over last 7 months  HENT:  Head: Normocephalic and atraumatic.  No temporal wasting  Cardiovascular: Normal rate and regular rhythm.   Pulmonary/Chest: Effort normal and breath sounds normal.  Abdominal: Soft. She exhibits no distension. There is tenderness (LUQ tenderness).  Musculoskeletal: She exhibits no  edema.       Right knee: She exhibits effusion.       Left knee: She exhibits effusion.  Fingers with limited ROM with flexion, tight, skin on the fingers rather shiny  Neurological: She is alert.  Skin: Skin is warm. No pallor.  Very dark macular nevus on the RIGHT leg distal to the knee, anterolateral aspect; surgical scar left side abdomen above the umbilicus with some pigmentation versus hypertrophic scar formation  Psychiatric: She has a normal mood and affect.      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[place])    Noted on prior hemoglobinopathy panel       Other Visit Diagnoses    Neoplasm of uncertain behavior    -  Primary   worrisome nevus on the RIGHT leg; refer to derm for excisional biopsy   Relevant Orders   Ambulatory referral to Dermatology   Breast cancer screening       mammogram ordered   Relevant Orders   MM Digital Screening   Abnormal weight loss       talked with patient about my concern for possible malignancy; in-house glucose normal (ruled out DM), check thyroid tests; return in 1 week, pt asked to wait 2   Relevant Orders   POCT Glucose (CBG) (Completed)   Ambulatory referral to Dermatology   Comprehensive metabolic panel (Completed)   CBC with Differential/Platelet (Completed)   TSH (Completed)   Urinalysis w microscopic + reflex cultur (Completed)   T4, free (Completed)   DG Esophagus   Ambulatory referral to General Surgery       Follow up  plan: Return in about 2 weeks (around 09/21/2016) for follow-up visit with Dr. Sanda Klein.  An after-visit summary was printed and given to the patient at [facility].  Please see the patient instructions which may contain other information and recommendations beyond what is mentioned above in the assessment and plan.  No orders of the defined types  were placed in this encounter.   Orders Placed This Encounter  Procedures  . MM Digital Screening  . DG Esophagus  . Comprehensive metabolic panel  . CBC with Differential/Platelet  . ANA  . TSH  . Urinalysis w microscopic + reflex cultur  . T4, free  . Ambulatory referral to Dermatology  . Ambulatory referral to General Surgery  . POCT Glucose (CBG)

## 2016-09-08 ENCOUNTER — Encounter: Payer: Self-pay | Admitting: Family Medicine

## 2016-09-08 ENCOUNTER — Telehealth: Payer: Self-pay | Admitting: General Surgery

## 2016-09-08 DIAGNOSIS — D582 Other hemoglobinopathies: Secondary | ICD-10-CM

## 2016-09-08 HISTORY — DX: Other hemoglobinopathies: D58.2

## 2016-09-08 LAB — URINALYSIS W MICROSCOPIC + REFLEX CULTURE
BACTERIA UA: NONE SEEN [HPF]
BILIRUBIN URINE: NEGATIVE
Casts: NONE SEEN [LPF]
Crystals: NONE SEEN [HPF]
GLUCOSE, UA: NEGATIVE
HGB URINE DIPSTICK: NEGATIVE
LEUKOCYTES UA: NEGATIVE
Nitrite: NEGATIVE
PROTEIN: NEGATIVE
SQUAMOUS EPITHELIAL / LPF: NONE SEEN [HPF] (ref <=5)
Specific Gravity, Urine: 1.008 (ref 1.001–1.035)
WBC UA: NONE SEEN WBC/HPF (ref <=5)
Yeast: NONE SEEN [HPF]
pH: 6.5 (ref 5.0–8.0)

## 2016-09-08 LAB — ANTI-NUCLEAR AB-TITER (ANA TITER): ANA Titer 1: 1:640 {titer} — ABNORMAL HIGH

## 2016-09-08 LAB — ANA: Anti Nuclear Antibody(ANA): POSITIVE — AB

## 2016-09-08 LAB — TSH: TSH: 2.09 mIU/L

## 2016-09-08 LAB — T4, FREE: Free T4: 1.2 ng/dL (ref 0.8–1.8)

## 2016-09-08 NOTE — Assessment & Plan Note (Signed)
Noted on prior hemoglobinopathy panel

## 2016-09-08 NOTE — Telephone Encounter (Signed)
I called & left a message for patient to call & schedule an appointment with Dr Bary Castilla for an EGD & colonoscopy for dysphagia,anemia & signficant weight loss ref by Dr Jeanella Cara.

## 2016-09-08 NOTE — Assessment & Plan Note (Signed)
Patient has altered her diet for this; reports no dysphagia as long as she doesn't eat red meat; refer to gen surgeron for EGD; originally ordered barium swallow but will get EGD instead

## 2016-09-09 ENCOUNTER — Encounter: Payer: Self-pay | Admitting: Family Medicine

## 2016-09-09 ENCOUNTER — Other Ambulatory Visit: Payer: Self-pay | Admitting: Family Medicine

## 2016-09-09 DIAGNOSIS — M25542 Pain in joints of left hand: Principal | ICD-10-CM

## 2016-09-09 DIAGNOSIS — R1310 Dysphagia, unspecified: Secondary | ICD-10-CM

## 2016-09-09 DIAGNOSIS — R1319 Other dysphagia: Secondary | ICD-10-CM

## 2016-09-09 DIAGNOSIS — R7689 Other specified abnormal immunological findings in serum: Secondary | ICD-10-CM

## 2016-09-09 DIAGNOSIS — R768 Other specified abnormal immunological findings in serum: Secondary | ICD-10-CM

## 2016-09-09 DIAGNOSIS — R634 Abnormal weight loss: Secondary | ICD-10-CM

## 2016-09-09 DIAGNOSIS — R5383 Other fatigue: Secondary | ICD-10-CM

## 2016-09-09 DIAGNOSIS — M25541 Pain in joints of right hand: Secondary | ICD-10-CM

## 2016-09-09 HISTORY — DX: Other specified abnormal immunological findings in serum: R76.89

## 2016-09-09 HISTORY — DX: Other specified abnormal immunological findings in serum: R76.8

## 2016-09-09 NOTE — Progress Notes (Signed)
Refer to rheum 

## 2016-09-09 NOTE — Assessment & Plan Note (Signed)
Refer to RHEUM

## 2016-09-20 ENCOUNTER — Ambulatory Visit (INDEPENDENT_AMBULATORY_CARE_PROVIDER_SITE_OTHER): Payer: 59 | Admitting: Family Medicine

## 2016-09-20 ENCOUNTER — Encounter: Payer: Self-pay | Admitting: General Surgery

## 2016-09-20 ENCOUNTER — Encounter: Payer: Self-pay | Admitting: Family Medicine

## 2016-09-20 VITALS — BP 92/66 | HR 97 | Temp 98.2°F | Resp 14 | Wt 96.0 lb

## 2016-09-20 DIAGNOSIS — R1310 Dysphagia, unspecified: Secondary | ICD-10-CM

## 2016-09-20 DIAGNOSIS — R1319 Other dysphagia: Secondary | ICD-10-CM

## 2016-09-20 DIAGNOSIS — R634 Abnormal weight loss: Secondary | ICD-10-CM

## 2016-09-20 DIAGNOSIS — D582 Other hemoglobinopathies: Secondary | ICD-10-CM

## 2016-09-20 DIAGNOSIS — D75839 Thrombocytosis, unspecified: Secondary | ICD-10-CM

## 2016-09-20 DIAGNOSIS — R768 Other specified abnormal immunological findings in serum: Secondary | ICD-10-CM | POA: Diagnosis not present

## 2016-09-20 DIAGNOSIS — D473 Essential (hemorrhagic) thrombocythemia: Secondary | ICD-10-CM | POA: Diagnosis not present

## 2016-09-20 NOTE — Assessment & Plan Note (Signed)
We reviewed her recent labs, but this most recent one showed anemia (low H/H) in addition to the expected microcytosis and hypochromia; she was urged to f/u with surgeon to consider EGD to r/o malignancy or gastritis or esophagitis with her weight loss and dysphagia

## 2016-09-20 NOTE — Patient Instructions (Addendum)
I am concerned that you may have cancer or a serious auto-immune disease and I urge you to see the specialists Please do follow-up after seeing them for further evaluation if needed  Please do call and schedule an appt with Dr. Annalee Genta (rheumatologist): ______________________  Same for Dr. Bary Castilla (general surgeon): __________________________  Same for Dr. Nehemiah Massed (dermatologist): __________________________

## 2016-09-20 NOTE — Assessment & Plan Note (Signed)
Concerning for malignancy or auto-immune process; reviewed labs with patient; she seems to either have limited insight into the seriousness of the issues or is in outright denial; I was very blunt in explaining my concern for her, that she may have cancer or a serious auto-immune disease, that either of these may be treatable but we need to find out what's going on to help her; she seems to think that moving more and eating well will cure everything; while I encourage healthy eating, I stressed the need to be evaluated and see specialists and work up her abdominal findings and labs

## 2016-09-20 NOTE — Assessment & Plan Note (Signed)
Patient has been referred to rheumatologist; I stressed tot he patient the importance of seeing the specialist, finding out what's going on, and getting treatment started; she was given the number to the specialist and urged to call today for an appointment

## 2016-09-20 NOTE — Assessment & Plan Note (Signed)
I do not think patient had an EGD last year, but I may be wrong; urged her to contact the surgeon and be evaluated, as she may have cancer or CREST syndrome; either way, she has lost significant weight, has abnormal labs, anemia, elevated platelet count, positive ANA, and has had to change her eating habits; she was given the number to contact the surgeon, and I even explained that his office is right here in the same building if she wants to stop by

## 2016-09-20 NOTE — Progress Notes (Signed)
BP 92/66   Pulse 97   Temp 98.2 F (36.8 C) (Oral)   Resp 14   Wt 96 lb (43.5 kg)   LMP 08/23/2016   SpO2 99%   BMI 18.14 kg/m    Subjective:    Patient ID: Bailey Perez, female    DOB: June 23, 1970, 46 y.o.   MRN: [id number]  HPI: Bailey Perez is a 46 y.o. female  Chief Complaint  Patient presents with  . Follow-up   HPI Patient is here for f/u Dr. Dwyane Luo office called her and she says she has not called back to schedule She thinks Dr. Francesca Oman office called and she has not called them back either She does not have an appointment yet with the dermatologist (3 referrals put in last visit)  She says that she already had the EGD last year, they put the tube down the throat; they have all the equipment she says right there in their office; it was in the building where [employer] is; I again questioned if it was ENT that she saw because there is no GI doctor at the [facility] building to my understanding, but there is where the ENT offices are She does not remember the doctor; she says she does not want to do that again She has lost the weight; no diarrhea; good solid bowel movements  I asked her opinion on her condition, she thinks she feels good She thinks she needs to move and exercise more; I asked for clarification; she says she needs more exercise If she eats right, she'll be okay, like vegetables She denies needing to lose weight; she says she needs to strengthen her body  We talked about her ANA, and she says it's because she is not eating enough to increase her blood flow Normal thyroid tests on recent labs (we reviewed all of her labs together today) Normal glucose, not diabetes Ketones positive Explained H/H; she says we need to wait a little bit and see; knows her body needs to heal itself and exercise and eat right; reviewed the normal H/H and now the drop in H/H Not eating enough nutrients, she explained the results away with that Appetite is  good she says  I asked if there is some reason that she doesn't want to find out what's wrong; she says she will go and find out and let her body heal; she thinks her body will probably get over this on its own if she sticks to her diet; I asked even if it's cancer, and she says yeah, she just needs to eat right and move more and her body will heal  Depression screen [facility] 2/9 09/07/2016 01/17/2016 12/24/2014  Decreased Interest 0 0 0  Down, Depressed, Hopeless 0 0 0  PHQ - 2 Score 0 0 0   Relevant past medical, surgical, family and social history reviewed Past Medical History:  Diagnosis Date  . ANA positive 09/09/2016   1:640 homogeneous pattern; refer to rheum  . Anemia    "hemoglobin E"  . 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 NASAL ENDOSCOPY     Dr. Clyde Canterbury   Family History  Problem Relation Age of Onset  . Cancer Paternal Aunt        breast  . Heart disease Neg Hx    Social History   Social History  . Marital status: Married    Spouse name: N/A  . Number  of children: N/A  . Years of education: N/A   Occupational History  . Not on file.   Social History Main Topics  . Smoking status: Never Smoker  . Smokeless tobacco: Never Used  . Alcohol use No  . Drug use: No  . Sexual activity: No   Other Topics Concern  . Not on file   Social History Narrative  . No narrative on file   Interim medical history since last visit reviewed. Allergies and medications reviewed  Review of Systems Per HPI unless specifically indicated above     Objective:    BP 92/66   Pulse 97   Temp 98.2 F (36.8 C) (Oral)   Resp 14   Wt 96 lb (43.5 kg)   LMP 08/23/2016   SpO2 99%   BMI 18.14 kg/m   Wt Readings from Last 3 Encounters:  09/20/16 96 lb (43.5 kg)  09/07/16 95 lb 8 oz (43.3 kg)  02/02/16 121 lb 12 oz (55.2 kg)    Physical Exam  Constitutional: She appears well-developed and well-nourished.  HENT:    Mouth/Throat: Mucous membranes are normal.  Eyes: EOM are normal. No scleral icterus.  Cardiovascular: Normal rate and regular rhythm.   Pulmonary/Chest: Effort normal and breath sounds normal.  Abdominal: Soft. Normal appearance. She exhibits no distension. There is no tenderness.  Musculoskeletal:       Right hand: She exhibits decreased range of motion.       Left hand: She exhibits decreased range of motion.  Skin:  Fingers are slightly shiny in appearance  Psychiatric: She has a normal mood and affect. Her mood appears not anxious. She does not exhibit a depressed mood.  Seems to exhibit limited insight into or outright denial of her overall health, lab findings, recent weight loss   Results for orders placed or performed in visit on 09/07/16  Comprehensive metabolic panel  Result Value Ref Range   Sodium 137 135 - 146 mmol/L   Potassium 4.4 3.5 - 5.3 mmol/L   Chloride 99 98 - 110 mmol/L   CO2 25 20 - 31 mmol/L   Glucose, Bld 77 65 - 99 mg/dL   BUN 6 (L) 7 - 25 mg/dL   Creat 0.57 0.50 - 1.10 mg/dL   Total Bilirubin 0.5 0.2 - 1.2 mg/dL   Alkaline Phosphatase 60 33 - 115 U/L   AST 10 10 - 35 U/L   ALT 5 (L) 6 - 29 U/L   Total Protein 7.3 6.1 - 8.1 g/dL   Albumin 3.5 (L) 3.6 - 5.1 g/dL   Calcium 9.1 8.6 - 10.2 mg/dL  CBC with Differential/Platelet  Result Value Ref Range   WBC 5.5 3.8 - 10.8 K/uL   RBC 4.54 3.80 - 5.10 MIL/uL   Hemoglobin 10.5 (L) 11.7 - 15.5 g/dL   HCT 33.2 (L) 35.0 - 45.0 %   MCV 73.1 (L) 80.0 - 100.0 fL   MCH 23.1 (L) 27.0 - 33.0 pg   MCHC 31.6 (L) 32.0 - 36.0 g/dL   RDW 14.9 11.0 - 15.0 %   Platelets 661 (H) 140 - 400 K/uL   MPV 9.2 7.5 - 12.5 fL   Neutro Abs 3,410 1,500 - 7,800 cells/uL   Lymphs Abs 1,595 850 - 3,900 cells/uL   Monocytes Absolute 440 200 - 950 cells/uL   Eosinophils Absolute 55 15 - 500 cells/uL   Basophils Absolute 0 0 - 200 cells/uL   Neutrophils Relative % 62 %   Lymphocytes Relative  29 %   Monocytes Relative 8 %    Eosinophils Relative 1 %   Basophils Relative 0 %   Smear Review Criteria for review not met   ANA  Result Value Ref Range   Anit Nuclear Antibody(ANA) POS (A) NEGATIVE  TSH  Result Value Ref Range   TSH 2.09 mIU/L  Urinalysis w microscopic + reflex cultur  Result Value Ref Range   Color, Urine YELLOW YELLOW   APPearance CLEAR CLEAR   Specific Gravity, Urine 1.008 1.001 - 1.035   pH 6.5 5.0 - 8.0   Glucose, UA NEGATIVE NEGATIVE   Bilirubin Urine NEGATIVE NEGATIVE   Ketones, ur 1+ (A) NEGATIVE   Hgb urine dipstick NEGATIVE NEGATIVE   Protein, ur NEGATIVE NEGATIVE   Nitrite NEGATIVE NEGATIVE   Leukocytes, UA NEGATIVE NEGATIVE   WBC, UA NONE SEEN <=5 WBC/HPF   RBC / HPF 0-2 <=2 RBC/HPF   Squamous Epithelial / LPF NONE SEEN <=5 HPF   Bacteria, UA NONE SEEN NONE SEEN HPF   Crystals NONE SEEN NONE SEEN HPF   Casts NONE SEEN NONE SEEN LPF   Yeast NONE SEEN NONE SEEN HPF  T4, free  Result Value Ref Range   Free T4 1.2 0.8 - 1.8 ng/dL  Anti-nuclear ab-titer (ANA titer)  Result Value Ref Range   ANA Pattern 1 HOMOGENEOUS (A)    ANA Titer 1 1:640 (H) titer  POCT Glucose (CBG)  Result Value Ref Range   POC Glucose 81 70 - 99 mg/dl      Assessment & Plan:   Problem List Items Addressed This Visit      Digestive   Dysphagia    I do not think patient had an EGD last year, but I may be wrong; urged her to contact the surgeon and be evaluated, as she may have cancer or CREST syndrome; either way, she has lost significant weight, has abnormal labs, anemia, elevated platelet count, positive ANA, and has had to change her eating habits; she was given the number to contact the surgeon, and I even explained that his office is right here in the same building if she wants to stop by        Other   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[place])    We reviewed her recent labs, but this most recent one showed anemia (low H/H) in addition to the expected microcytosis and hypochromia; she was urged to f/u with  surgeon to consider EGD to r/o malignancy or gastritis or esophagitis with her weight loss and dysphagia      ANA positive    Patient has been referred to rheumatologist; I stressed tot he patient the importance of seeing the specialist, finding out what's going on, and getting treatment started; she was given the number to the specialist and urged to call today for an appointment      Abnormal weight loss - Primary    Concerning for malignancy or auto-immune process; reviewed labs with patient; she seems to either have limited insight into the seriousness of the issues or is in outright denial; I was very blunt in explaining my concern for her, that she may have cancer or a serious auto-immune disease, that either of these may be treatable but we need to find out what's going on to help her; she seems to think that moving more and eating well will cure everything; while I encourage healthy eating, I stressed the need to be evaluated and see specialists and work up her  abdominal findings and labs       Other Visit Diagnoses    Thrombocytosis ([place])       explained may be acute phase reactant, related to process causing her weight loss (cancer or auto-immune process)       Follow up plan: Return in about 4 weeks (around 10/18/2016) for follow-up visit with Dr. Sanda Klein.  An after-visit summary was printed and given to the patient at [place].  Please see the patient instructions which may contain other information and recommendations beyond what is mentioned above in the assessment and plan.  Meds ordered this encounter  Medications  . BLACK CURRANT SEED OIL PO    Sig: Take by mouth.  . TURMERIC PO    Sig: Take by mouth.  . Omega-3 Fatty Acids (FISH OIL) 1000 MG CAPS    Sig: Take by mouth.  . calcium carbonate 1250 MG capsule    Sig: Take 1,250 mg by mouth 2 (two) times daily with a meal.    No orders of the defined types were placed in this encounter.

## 2016-10-04 ENCOUNTER — Encounter: Payer: Self-pay | Admitting: General Surgery

## 2016-10-04 ENCOUNTER — Ambulatory Visit (INDEPENDENT_AMBULATORY_CARE_PROVIDER_SITE_OTHER): Payer: 59 | Admitting: General Surgery

## 2016-10-04 VITALS — BP 100/60 | HR 108 | Resp 12 | Ht 62.0 in | Wt 93.0 lb

## 2016-10-04 DIAGNOSIS — R1310 Dysphagia, unspecified: Secondary | ICD-10-CM | POA: Diagnosis not present

## 2016-10-04 DIAGNOSIS — R1084 Generalized abdominal pain: Secondary | ICD-10-CM

## 2016-10-04 DIAGNOSIS — R634 Abnormal weight loss: Secondary | ICD-10-CM | POA: Diagnosis not present

## 2016-10-04 NOTE — Progress Notes (Signed)
Patient ID: Bailey Perez, female   DOB: Feb 26, 1971, 46 y.o.   MRN: [id number]  Chief Complaint  Patient presents with  . Colonoscopy    HPI Bailey Perez is a 46 y.o. female here today for a evaluation of a screening colonoscopy.Patient states she is not having any GI problems at this time.Moves her bowels two time daily.She states she last lost two pounds in the last last two weeks. Patient states she was 120 pounds last year. Sometimes she has difficulty swallowing meat, feels like the food get stick.  Works at [employer] as a Engineer, production.   HPI  Past Medical History:  Diagnosis Date  . ANA positive 09/09/2016   1:640 homogeneous pattern; refer to rheum  . Anemia    "hemoglobin E"  . 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 NASAL ENDOSCOPY     Dr. Clyde Canterbury    Family History  Problem Relation Age of Onset  . Cancer Paternal Aunt        breast  . Heart disease Neg Hx     Social History Social History  Substance Use Topics  . Smoking status: Never Smoker  . Smokeless tobacco: Never Used  . Alcohol use No    No Known Allergies  Current Outpatient Prescriptions  Medication Sig Dispense Refill  . BLACK CURRANT SEED OIL PO Take by mouth.    . calcium carbonate 1250 MG capsule Take 1,250 mg by mouth 2 (two) times daily with a meal.    . Echinacea 400 MG CAPS Take 400 mg by mouth daily.    . Omega-3 Fatty Acids (FISH OIL) 1000 MG CAPS Take by mouth.    . TURMERIC PO Take by mouth.     No current facility-administered medications for this visit.     Review of Systems Review of Systems  Constitutional: Positive for appetite change.  Respiratory: Negative.   Cardiovascular: Negative.   Gastrointestinal: Negative.     Blood pressure 100/60, pulse (!) 108, resp. rate 12, height 5\' 2"  (1.575 m), weight 93 lb (42.2 kg), last menstrual period 09/26/2016.  Physical Exam Physical Exam  Constitutional: She is  oriented to person, place, and time. She appears well-developed and well-nourished.  Eyes: Conjunctivae are normal. No scleral icterus.  Neck: Neck supple.  Cardiovascular: Normal rate, regular rhythm and normal heart sounds.   Pulmonary/Chest: Effort normal and breath sounds normal.  Abdominal: Soft. Bowel sounds are normal. There is no tenderness.  Lymphadenopathy:    She has no cervical adenopathy.  Neurological: She is alert and oriented to person, place, and time.  Skin: Skin is warm and dry.    Data Reviewed Laboratory studies from September 2016 through May 2018 reviewed. Modest fall in hemoglobin from 13.0-10.5, most pronounced over the last 8 months. MCV minimal he changed 70 12/29/2014, 73 at present. Platelet count has steadily risen 184,000 in September 2016, 661,000 present. Normal Total neutrophil count. Normal differential  Comprehensive metabolic panel notable only for a scant low serum albumin at 3.5. Previously 4.4 in fall 2017.  Hemoglobin electrophoresis of 01/19/2016. 72% hemoglobin A, remainder hemoglobin E. HIV testing of the same date was negative.  Ferritin, iron, TIBC and iron saturation were all normal 01/19/2016.  ANA testing positive May 2018. 1:640, strongly positive, homogeneous.  Up to Date review of hemoglobin E:  Hemoglobin E (HbE), a mutation of the beta globin chain, is associated with reduced expression (ie, it is  a hemoglobin mutation which also expresses a thalassemic blood picture) and is mildly unstable to oxidative damage [21,22]. The presence of HbE causes few abnormalities other than microcytosis, target cells, and hypochromia when present in a heterozygous or homozygous state [23]: ?Heterozygotes (hemoglobin E trait, HbAE) are not usually anemic, but may have minimal degrees of microcytosis and hypochromia. Hemoglobin analysis shows approximately 30 percent HbE, 1 percent HbF, and 70 percent HbA. This mutation is very frequent in the [place]  subcontinent and [place], where the frequency of HbE approaches 60 percent, presumably because of the resistance of HbAE red cells to invasion by P. falciparum   Assessment    Profound weight loss over the last 7 months with minimal GI symptoms. Questionable dysphagia versus food avoidance.  Modest anemia likely secondary to nutritional rather than ongoing GI blood loss.  Some indifference on the patient's part to her profound weight loss.    Plan    Stool Hemoccult slides will be of benefit, and the patient will be requested to return to pick these up.  Prior to proceeding to upper endoscopy or colonoscopy I think a functional study of her swallowing mechanism and assessment of her gallbladder would be appropriate. The small bowel study will assess whether this any component of inflammatory bowel disease for which she is at higher risk based on her positive ANA study. This area of the intestine would not be routinely assessed on endoscopy.    Patient to have a upper GI and ultrasound.   HPI, Physical Exam, Assessment and Plan have been scribed under the direction and in the presence of Hervey Ard, MD.  Gaspar Cola, CMA  I have completed the exam and reviewed the above documentation for accuracy and completeness.  I agree with the above.  Haematologist has been used and any errors in dictation or transcription are unintentional.  Hervey Ard, M.D., F.A.C.S.   Robert Bellow 10/06/2016, 7:22 AM  Patient has been scheduled for a gallbladder ultrasound and upper GI/SBFT at [facility] for 10-11-16 at 9:30 am (arrive 9:15 am; upper GI scheduled for 10:30 am). Prep: NPO after midnight. This patient is aware of date, time, and instructions. She verbalizes understanding.   This patient will be contacted once results are available.   Dominga Ferry, CMA

## 2016-10-04 NOTE — Patient Instructions (Addendum)
Patient to have a upper GI and ultrasound.

## 2016-10-06 DIAGNOSIS — R1084 Generalized abdominal pain: Secondary | ICD-10-CM | POA: Insufficient documentation

## 2016-10-11 ENCOUNTER — Ambulatory Visit
Admission: RE | Admit: 2016-10-11 | Discharge: 2016-10-11 | Disposition: A | Discharge status: Home / Self Care | Payer: 59 | Source: Ambulatory Visit | Attending: General Surgery | Admitting: General Surgery

## 2016-10-11 DIAGNOSIS — R1084 Generalized abdominal pain: Secondary | ICD-10-CM

## 2016-10-11 DIAGNOSIS — R634 Abnormal weight loss: Secondary | ICD-10-CM | POA: Insufficient documentation

## 2016-10-11 DIAGNOSIS — R1011 Right upper quadrant pain: Secondary | ICD-10-CM | POA: Diagnosis not present

## 2016-10-12 ENCOUNTER — Telehealth: Payer: Self-pay | Admitting: *Deleted

## 2016-10-12 NOTE — Telephone Encounter (Signed)
I spoke with the patient this morning and asked if she had seen the My Chart message from Dr. Bary Castilla yesterday.   Patient states that she has not seen his message.   Patient notified of results from upper GI/SBFT and aware that Dr. Bary Castilla is recommending a colonoscopy and upper endoscopy to find out why she is anemic. This was also recommended by Dr. Sanda Klein.   Patient declined having upper and lower endoscopy done at this time. She states she just does not want to do it right now.   Patient aware that if she does not want to have a colonoscopy that an upper endoscopy is recommended at the least.  This patient still declined further testing. She is aware to call the office back if she changes her mind.

## 2016-12-27 DIAGNOSIS — D225 Melanocytic nevi of trunk: Secondary | ICD-10-CM | POA: Diagnosis not present

## 2016-12-27 DIAGNOSIS — D2271 Melanocytic nevi of right lower limb, including hip: Secondary | ICD-10-CM | POA: Diagnosis not present

## 2016-12-27 DIAGNOSIS — L812 Freckles: Secondary | ICD-10-CM | POA: Diagnosis not present

## 2016-12-27 DIAGNOSIS — D229 Melanocytic nevi, unspecified: Secondary | ICD-10-CM | POA: Diagnosis not present

## 2016-12-27 DIAGNOSIS — L720 Epidermal cyst: Secondary | ICD-10-CM | POA: Diagnosis not present

## 2016-12-27 DIAGNOSIS — D485 Neoplasm of uncertain behavior of skin: Secondary | ICD-10-CM | POA: Diagnosis not present

## 2016-12-27 DIAGNOSIS — L578 Other skin changes due to chronic exposure to nonionizing radiation: Secondary | ICD-10-CM | POA: Diagnosis not present

## 2017-01-15 ENCOUNTER — Encounter: Payer: Self-pay | Admitting: Family Medicine

## 2017-02-02 ENCOUNTER — Ambulatory Visit
Admission: RE | Admit: 2017-02-02 | Discharge: 2017-02-02 | Disposition: A | Discharge status: Home / Self Care | Payer: 59 | Source: Ambulatory Visit | Attending: Family Medicine | Admitting: Family Medicine

## 2017-02-02 ENCOUNTER — Other Ambulatory Visit: Payer: Self-pay | Admitting: Family Medicine

## 2017-02-02 DIAGNOSIS — Z1239 Encounter for other screening for malignant neoplasm of breast: Secondary | ICD-10-CM

## 2017-02-02 DIAGNOSIS — R928 Other abnormal and inconclusive findings on diagnostic imaging of breast: Secondary | ICD-10-CM

## 2017-02-02 DIAGNOSIS — Z1231 Encounter for screening mammogram for malignant neoplasm of breast: Secondary | ICD-10-CM | POA: Diagnosis not present

## 2017-02-02 DIAGNOSIS — N6489 Other specified disorders of breast: Secondary | ICD-10-CM

## 2017-02-21 ENCOUNTER — Encounter: Payer: Self-pay | Admitting: Family Medicine

## 2017-02-21 ENCOUNTER — Ambulatory Visit (INDEPENDENT_AMBULATORY_CARE_PROVIDER_SITE_OTHER): Payer: 59 | Admitting: Family Medicine

## 2017-02-21 VITALS — BP 102/60 | HR 86 | Temp 98.4°F | Resp 16 | Ht 62.0 in | Wt 95.6 lb

## 2017-02-21 DIAGNOSIS — Z01419 Encounter for gynecological examination (general) (routine) without abnormal findings: Secondary | ICD-10-CM | POA: Diagnosis not present

## 2017-02-21 DIAGNOSIS — Z124 Encounter for screening for malignant neoplasm of cervix: Secondary | ICD-10-CM | POA: Diagnosis not present

## 2017-02-21 DIAGNOSIS — Z681 Body mass index (BMI) 19 or less, adult: Secondary | ICD-10-CM | POA: Diagnosis not present

## 2017-02-21 DIAGNOSIS — Z862 Personal history of diseases of the blood and blood-forming organs and certain disorders involving the immune mechanism: Secondary | ICD-10-CM | POA: Diagnosis not present

## 2017-02-21 DIAGNOSIS — Z13220 Encounter for screening for lipoid disorders: Secondary | ICD-10-CM

## 2017-02-21 DIAGNOSIS — Z1159 Encounter for screening for other viral diseases: Secondary | ICD-10-CM | POA: Diagnosis not present

## 2017-02-21 NOTE — Progress Notes (Signed)
Name: Bailey Perez   MRN: [id number]    DOB: Mar 26, 1971   Date:02/21/2017       Progress Note  Subjective  Chief Complaint  Chief Complaint  Patient presents with  . Annual Exam    HPI  Patient presents for annual CPE w/ Pap.   - Pt has no complaints today, but review of her records shows that she has been seen over the last year for abnormal weight loss, and Dr. Bary Castilla recommended to have upper and lower endoscopy which she declined. Weight appears stable today.  - Diet: She is on the go and eats a lot of "junk snacks"; sometimes has oatmeal, eggs, a bar, etc. Lunch - skips lunch; Dinner - chicken, fish, sometimes pork and beef, rice, potatoes. 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  Says she would like to add more fruit and vegetables. 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  USPSTF grade A and B recommendations  Depression: Discussed scoreof PHQ-9 as 5 - pt declines treatment today, we will continue to monitor symptoms Depression screen [facility] 2/9 02/21/2017 09/07/2016 01/17/2016 12/24/2014  Decreased Interest 1 0 0 0  Down, Depressed, Hopeless 0 0 0 0  PHQ - 2 Score 1 0 0 0  Altered sleeping 2 - - -  Tired, decreased energy 2 - - -  Change in appetite 0 - - -  Feeling bad or failure about yourself  0 - - -  Trouble concentrating 0 - - -  Moving slowly or fidgety/restless 0 - - -  Suicidal thoughts 0 - - -  PHQ-9 Score 5 - - -  Difficult doing work/chores Somewhat difficult - - -   Hypertension: BP Readings from Last 3 Encounters:  02/21/17 102/60  10/04/16 100/60  09/20/16 92/66   Obesity: Discussed weight and low BMI - dietary strategies including small frequent meals with healthy fats and proteins. 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 STD testing and prevention (chl/gon/syphilis): Declines today. Intimate partner violence: No concerns; is married Sexual History/Pain during Intercourse: Only if she is having some back pain etc. Menstrual History/LMP/Abnormal Bleeding: 02/01/2017 Incontinence Symptoms: Denies.  Advanced Care Planning: A voluntary discussion about advance care planning including the explanation and discussion of advance directives.  Discussed health care proxy and Living will, and the patient was able to identify a health care proxy as Husband Amph Kuk.  Patient does not have a living will at present time. If patient does have living will, I have requested they bring this to the clinic to be scanned in to their chart.  Breast cancer: 02/02/2017 - orders have been placed by PCP to have follow up imaging. Pt will call breast center to schedule follow up imaging. No results found for: HMMAMMO  BRCA gene screening: No personal history of breast cancer; Paternal aunt has hx breast cancer. Cervical cancer screening: Pap due today  Lipids:  Lab Results  Component Value Date  CHOL 158 01/17/2016   CHOL 193 12/25/2014   Lab Results  Component Value Date   HDL 54 01/17/2016   HDL 67 12/25/2014   Lab Results  Component Value Date   LDLCALC 90 01/17/2016   LDLCALC 114 (H) 12/25/2014   Lab Results  Component Value Date   TRIG 72 01/17/2016   TRIG 59 12/25/2014   Lab Results  Component Value Date   CHOLHDL 2.9 01/17/2016   CHOLHDL 2.9 12/25/2014   No results found for: LDLDIRECT  Glucose:  Glucose  Date Value Ref Range Status  12/25/2014 85 65 - 99 mg/dL Final    Comment:    Specimen received in contact with cells. No visible hemolysis present.  However GLUC may be decreased and K increased. Clinical correlation indicated.    Glucose, Bld  Date Value Ref Range Status  09/07/2016 77 65 - 99 mg/dL Final  01/17/2016 92 65 - 99 mg/dL Final   Skin cancer: No concerning lesions - has seen dermatology and had two biopsies in the past - plans to go back to have skin survey Colorectal cancer: Was recommended to have colonoscopy and endoscopy by Dr. Bary Castilla, but she declines.  Again strongly encouraged her to have this screening performed per Dr. Dwyane Luo recommendations.  Patient Active Problem List   Diagnosis Date Noted  . Generalized abdominal pain 10/06/2016  . ANA positive 09/09/2016  . Abnormal weight loss 09/09/2016  . 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 Joint pain 09/07/2016  . Dysphagia 09/07/2016  . Screening mammogram, encounter for 01/26/2016  . Microcytosis 01/18/2016  . Screening for HIV (human immunodeficiency virus) 01/17/2016  . Chest tightness 01/17/2016  . Arthritis 12/24/2014  . Breast pain 12/24/2014  . History of migraine headaches 12/24/2014  . Mastodynia 12/24/2014  . Annual physical exam 12/24/2014  . Encounter for cholesteral screening for cardiovascular disease 12/24/2014  . Other fatigue 12/24/2014  . Screening for STD (sexually transmitted disease) 12/24/2014    Past Surgical History:  Procedure Laterality Date  . NASAL ENDOSCOPY     Dr. Clyde Canterbury    Family History  Problem Relation Age of Onset  . Cancer Paternal Aunt        breast  . Breast cancer Paternal Aunt   . Heart disease Neg Hx     Social History   Social History  . Marital status: Married    Spouse name: N/A  . Number of children: N/A  . Years of education: N/A   Occupational History  . Not on file.   Social History Main Topics  . Smoking status: Never Smoker  . Smokeless tobacco: Never Used  . Alcohol use No  . Drug use: No  . Sexual activity: No   Other Topics Concern  . Not on file   Social History  Narrative  . No narrative on file    Current Outpatient Prescriptions:  .  BLACK CURRANT SEED OIL PO, Take by mouth., Disp: , Rfl:  .  calcium carbonate 1250 MG capsule, Take 1,250 mg by mouth 2 (two) times daily with a meal., Disp: , Rfl:  .  Echinacea 400 MG CAPS, Take 400 mg by mouth daily., Disp: , Rfl:  .  Omega-3 Fatty Acids (FISH OIL) 1000 MG CAPS, Take by mouth., Disp: , Rfl:  .  TURMERIC PO, Take by mouth., Disp: , Rfl:   No Known Allergies   ROS  Constitutional: Negative for fever or weight change.  Respiratory: Negative for cough and  shortness of breath.   Cardiovascular: Negative for chest pain or palpitations.  Gastrointestinal: Negative for abdominal pain, no bowel changes.  Musculoskeletal: Negative for gait problem or joint swelling.  Skin: Negative for rash.  Neurological: Negative for dizziness or headache.  No other specific complaints in a complete review of systems (except as listed in HPI above).  Objective  Vitals:   02/21/17 0842  BP: 102/60  Pulse: 86  Resp: 16  Temp: 98.4 F (36.9 C)  TempSrc: Oral  SpO2: 96%  Weight: 95 lb 9.6 oz (43.4 kg)  Height: _0  (1.575 m)    Body mass index is 17.49 kg/m.  Physical Exam Constitutional: Patient appears well-developed and well-nourished. No distress.  HENT: Head: Normocephalic and atraumatic. Ears: B TMs ok, no erythema or effusion; Nose: Nose normal. Mouth/Throat: Oropharynx is clear and moist. No oropharyngeal exudate.  Eyes: Conjunctivae and EOM are normal. Pupils are equal, round, and reactive to light. No scleral icterus.  Neck: Normal range of motion. Neck supple. No JVD present. No thyromegaly present.  Cardiovascular: Normal rate, regular rhythm and normal heart sounds.  No murmur heard. No BLE edema. Pulmonary/Chest: Effort normal and breath sounds normal. No respiratory distress. Abdominal: Soft. Bowel sounds are normal, no distension. There is no tenderness. no masses Breast: no lumps or  masses, no nipple discharge or rashes FEMALE GENITALIA:  External genitalia normal External urethra normal Vaginal vault normal without discharge or lesions Cervix normal with small amount thin white discharge; no lesions Bimanual exam normal without masses RECTAL: no rectal masses or hemorrhoids Musculoskeletal: Normal range of motion, no joint effusions. No gross deformities Neurological: she is alert and oriented to person, place, and time. No cranial nerve deficit. Coordination, balance, strength, speech and gait are normal.  Skin: Skin is warm and dry. No rash noted. No erythema.  Psychiatric: Patient has a normal mood and affect. behavior is normal. Judgment and thought content normal.   No results found for this or any previous visit (from the past 2160 hour(s)).  PHQ2/9: Depression screen [facility] 2/9 02/21/2017 09/07/2016 01/17/2016 12/24/2014  Decreased Interest 1 0 0 0  Down, Depressed, Hopeless 0 0 0 0  PHQ - 2 Score 1 0 0 0  Altered sleeping 2 - - -  Tired, decreased energy 2 - - -  Change in appetite 0 - - -  Feeling bad or failure about yourself  0 - - -  Trouble concentrating 0 - - -  Moving slowly or fidgety/restless 0 - - -  Suicidal thoughts 0 - - -  PHQ-9 Score 5 - - -  Difficult doing work/chores Somewhat difficult - - -   Fall Risk: Fall Risk  09/07/2016 01/17/2016 12/24/2014  Falls in the past year? No No No    Assessment & Plan  1. Well woman exam with routine gynecological exam -USPSTF grade A and B recommendations reviewed with patient; age-appropriate recommendations, preventive care, screening tests, etc discussed and encouraged; healthy living encouraged; see AVS for patient education given to patient -Discussed importance of 150 minutes of physical activity weekly, eat two servings of fish weekly, eat one serving of tree nuts ( cashews, pistachios, pecans, almonds.Marland Kitchen) every other day, eat 6 servings of fruit/vegetables daily and drink plenty of water and avoid  sweet beverages.  - Pap IG and HPV (high risk) DNA detection  2. History of anemia - CBC  3. Encounter for hepatitis C screening test for low risk patient - Hepatitis C antibody  4.  Screening for cholesterol level - Lipid panel  5. BMI less than 19,adult - Lipid panel - Basic Metabolic Panel (BMET) - Discussed the need for small frequent meals that contain healthy fats, proteins, starches, fruits, and vegetables - encouraged a well-balanced diet and taking time for herself to relax and eat instead of feeling rushed to eat or skipping meals.  6. Cervical cancer screening - Pap IG and HPV (high risk) DNA detection

## 2017-02-21 NOTE — Patient Instructions (Addendum)
- Please call the breast center to schedule the additional testing that Dr. Sanda Klein has ordered for you.  - Please very seriously consider having colonoscopy and upper endoscopy as recommended by Dr. Bary Castilla. Feel free to call our office or his office if you would like to have this performed  Preventive Care 40-64 Years, Female Preventive care refers to lifestyle choices and visits with your health care provider that can promote health and wellness. What does preventive care include?  A yearly physical exam. This is also called an annual well check.  Dental exams once or twice a year.  Routine eye exams. Ask your health care provider how often you should have your eyes checked.  Personal lifestyle choices, including: ? Daily care of your teeth and gums. ? Regular physical activity. ? Eating a healthy diet. ? Avoiding tobacco and drug use. ? Limiting alcohol use. ? Practicing safe sex. ? Taking low-dose aspirin daily starting at age 69. ? Taking vitamin and mineral supplements as recommended by your health care provider. What happens during an annual well check? The services and screenings done by your health care provider during your annual well check will depend on your age, overall health, lifestyle risk factors, and family history of disease. Counseling Your health care provider may ask you questions about your:  Alcohol use.  Tobacco use.  Drug use.  Emotional well-being.  Home and relationship well-being.  Sexual activity.  Eating habits.  Work and work Statistician.  Method of birth control.  Menstrual cycle.  Pregnancy history.  Screening You may have the following tests or measurements:  Height, weight, and BMI.  Blood pressure.  Lipid and cholesterol levels. These may be checked every 5 years, or more frequently if you are over 32 years old.  Skin check.  Lung cancer screening. You may have this screening every year starting at age 82 if you have a  30-pack-year history of smoking and currently smoke or have quit within the past 15 years.  Fecal occult blood test (FOBT) of the stool. You may have this test every year starting at age 18.  Flexible sigmoidoscopy or colonoscopy. You may have a sigmoidoscopy every 5 years or a colonoscopy every 10 years starting at age 35.  Hepatitis C blood test.  Hepatitis B blood test.  Sexually transmitted disease (STD) testing.  Diabetes screening. This is done by checking your blood sugar (glucose) after you have not eaten for a while (fasting). You may have this done every 1-3 years.  Mammogram. This may be done every 1-2 years. Talk to your health care provider about when you should start having regular mammograms. This may depend on whether you have a family history of breast cancer.  BRCA-related cancer screening. This may be done if you have a family history of breast, ovarian, tubal, or peritoneal cancers.  Pelvic exam and Pap test. This may be done every 3 years starting at age 27. Starting at age 46, this may be done every 5 years if you have a Pap test in combination with an HPV test.  Bone density scan. This is done to screen for osteoporosis. You may have this scan if you are at high risk for osteoporosis.  Discuss your test results, treatment options, and if necessary, the need for more tests with your health care provider. Vaccines Your health care provider may recommend certain vaccines, such as:  Influenza vaccine. This is recommended every year.  Tetanus, diphtheria, and acellular pertussis (Tdap, Td) vaccine. You  may need a Td booster every 10 years.  Varicella vaccine. You may need this if you have not been vaccinated.  Zoster vaccine. You may need this after age 79.  Measles, mumps, and rubella (MMR) vaccine. You may need at least one dose of MMR if you were born in 1957 or later. You may also need a second dose.  Pneumococcal 13-valent conjugate (PCV13) vaccine. You may  need this if you have certain conditions and were not previously vaccinated.  Pneumococcal polysaccharide (PPSV23) vaccine. You may need one or two doses if you smoke cigarettes or if you have certain conditions.  Meningococcal vaccine. You may need this if you have certain conditions.  Hepatitis A vaccine. You may need this if you have certain conditions or if you travel or work in places where you may be exposed to hepatitis A.  Hepatitis B vaccine. You may need this if you have certain conditions or if you travel or work in places where you may be exposed to hepatitis B.  Haemophilus influenzae type b (Hib) vaccine. You may need this if you have certain conditions.  Talk to your health care provider about which screenings and vaccines you need and how often you need them. This information is not intended to replace advice given to you by your health care provider. Make sure you discuss any questions you have with your health care provider. Document Released: 05/07/2015 Document Revised: 12/29/2015 Document Reviewed: 02/09/2015 Elsevier Interactive Patient Education  2017 [employer].

## 2017-02-22 LAB — BASIC METABOLIC PANEL
BUN: 8 mg/dL (ref 7–25)
CALCIUM: 9.3 mg/dL (ref 8.6–10.2)
CHLORIDE: 102 mmol/L (ref 98–110)
CO2: 27 mmol/L (ref 20–32)
Creat: 0.53 mg/dL (ref 0.50–1.10)
GLUCOSE: 83 mg/dL (ref 65–99)
Potassium: 5 mmol/L (ref 3.5–5.3)
SODIUM: 137 mmol/L (ref 135–146)

## 2017-02-22 LAB — CBC
HCT: 35.5 % (ref 35.0–45.0)
Hemoglobin: 11.2 g/dL — ABNORMAL LOW (ref 11.7–15.5)
MCH: 23.2 pg — AB (ref 27.0–33.0)
MCHC: 31.5 g/dL — ABNORMAL LOW (ref 32.0–36.0)
MCV: 73.5 fL — AB (ref 80.0–100.0)
MPV: 10.1 fL (ref 7.5–12.5)
Platelets: 598 10*3/uL — ABNORMAL HIGH (ref 140–400)
RBC: 4.83 10*6/uL (ref 3.80–5.10)
RDW: 14.3 % (ref 11.0–15.0)
WBC: 6.2 10*3/uL (ref 3.8–10.8)

## 2017-02-22 LAB — LIPID PANEL
Cholesterol: 135 mg/dL (ref <200)
HDL: 60 mg/dL (ref >50)
LDL CHOLESTEROL (CALC): 61 mg/dL
NON-HDL CHOLESTEROL (CALC): 75 mg/dL (ref <130)
TRIGLYCERIDES: 68 mg/dL (ref <150)
Total CHOL/HDL Ratio: 2.3 (calc) (ref <5.0)

## 2017-02-22 LAB — HEPATITIS C ANTIBODY
HEP C AB: NONREACTIVE
SIGNAL TO CUT-OFF: 0.03 (ref <1.00)

## 2017-02-23 LAB — PAP IG AND HPV HIGH-RISK: HPV DNA HIGH RISK: NOT DETECTED

## 2017-03-09 ENCOUNTER — Telehealth: Payer: Self-pay | Admitting: Family Medicine

## 2017-03-09 ENCOUNTER — Ambulatory Visit
Admission: RE | Admit: 2017-03-09 | Discharge: 2017-03-09 | Disposition: A | Discharge status: Home / Self Care | Payer: 59 | Source: Ambulatory Visit | Attending: Family Medicine | Admitting: Family Medicine

## 2017-03-09 DIAGNOSIS — R928 Other abnormal and inconclusive findings on diagnostic imaging of breast: Secondary | ICD-10-CM

## 2017-03-09 DIAGNOSIS — N6311 Unspecified lump in the right breast, upper outer quadrant: Secondary | ICD-10-CM | POA: Diagnosis not present

## 2017-03-09 DIAGNOSIS — R590 Localized enlarged lymph nodes: Secondary | ICD-10-CM | POA: Diagnosis not present

## 2017-03-09 DIAGNOSIS — N6489 Other specified disorders of breast: Secondary | ICD-10-CM | POA: Diagnosis not present

## 2017-03-09 DIAGNOSIS — R922 Inconclusive mammogram: Secondary | ICD-10-CM | POA: Diagnosis not present

## 2017-03-09 DIAGNOSIS — N63 Unspecified lump in unspecified breast: Secondary | ICD-10-CM | POA: Insufficient documentation

## 2017-03-09 NOTE — Telephone Encounter (Signed)
The patient has abnormal breast imaging and needs to have biopsies of the breast nodule and the lymph node on the RIGHT Please put in orders and I'll be glad to co-sign; these should be done with US-guidance with interventional radiologist Please also let the patient know that we hope the best for her and will be looking for these results; contact us if she has not heard anything 1 week after her appointment Thank you

## 2017-03-09 NOTE — Assessment & Plan Note (Signed)
Biopsy recommended

## 2017-03-09 NOTE — Assessment & Plan Note (Signed)
Patient needs biopsy

## 2017-03-12 ENCOUNTER — Other Ambulatory Visit: Payer: Self-pay | Admitting: Family Medicine

## 2017-03-12 DIAGNOSIS — R928 Other abnormal and inconclusive findings on diagnostic imaging of breast: Secondary | ICD-10-CM

## 2017-03-12 DIAGNOSIS — N6310 Unspecified lump in the right breast, unspecified quadrant: Secondary | ICD-10-CM

## 2017-03-12 NOTE — Telephone Encounter (Signed)
Thank you so much Please do pass along our well wishes and instructions to call; thank you

## 2017-03-12 NOTE — Telephone Encounter (Signed)
FYI: I was informed by [facility] staff) that all abnormal breast imaging is handle by Dollene Cleveland with [facility]. They schedule the orders then inform the patient of the date and time.

## 2017-04-11 ENCOUNTER — Telehealth: Payer: Self-pay | Admitting: Family Medicine

## 2017-04-11 NOTE — Telephone Encounter (Signed)
I see that patient's biopsies are scheduled for Dec 27th New reminder sent to make sure she follows through

## 2017-04-19 ENCOUNTER — Ambulatory Visit
Admission: RE | Admit: 2017-04-19 | Discharge: 2017-04-19 | Disposition: A | Discharge status: Home / Self Care | Payer: 59 | Source: Ambulatory Visit | Attending: Family Medicine | Admitting: Family Medicine

## 2017-04-19 ENCOUNTER — Other Ambulatory Visit: Payer: Self-pay | Admitting: Family Medicine

## 2017-04-19 DIAGNOSIS — I889 Nonspecific lymphadenitis, unspecified: Secondary | ICD-10-CM | POA: Insufficient documentation

## 2017-04-19 DIAGNOSIS — R928 Other abnormal and inconclusive findings on diagnostic imaging of breast: Secondary | ICD-10-CM

## 2017-04-19 DIAGNOSIS — R599 Enlarged lymph nodes, unspecified: Secondary | ICD-10-CM | POA: Diagnosis not present

## 2017-04-19 DIAGNOSIS — N6310 Unspecified lump in the right breast, unspecified quadrant: Secondary | ICD-10-CM | POA: Insufficient documentation

## 2017-04-19 DIAGNOSIS — N6001 Solitary cyst of right breast: Secondary | ICD-10-CM | POA: Diagnosis not present

## 2017-04-19 DIAGNOSIS — R590 Localized enlarged lymph nodes: Secondary | ICD-10-CM | POA: Diagnosis not present

## 2017-04-20 LAB — SURGICAL PATHOLOGY

## 2017-08-20 ENCOUNTER — Telehealth: Payer: Self-pay

## 2017-08-20 NOTE — Telephone Encounter (Signed)
Called and informed patient all her lab work and CPE paperwork is upfront waiting on her.

## 2017-08-20 NOTE — Telephone Encounter (Signed)
Copied from [place]. Topic: Medical Record Request - Patient ROI Request >> Aug 17, 2017 10:45 AM Neva Seat wrote: Pt needing a copy of: last year's CPE done on 02-21-17 and Blood Test/work

## 2018-03-01 ENCOUNTER — Ambulatory Visit (INDEPENDENT_AMBULATORY_CARE_PROVIDER_SITE_OTHER): Payer: 59 | Admitting: Family Medicine

## 2018-03-01 ENCOUNTER — Encounter: Payer: Self-pay | Admitting: Family Medicine

## 2018-03-01 ENCOUNTER — Other Ambulatory Visit: Payer: Self-pay | Admitting: Family Medicine

## 2018-03-01 VITALS — BP 98/62 | HR 87 | Temp 97.6°F | Ht 61.0 in | Wt 105.3 lb

## 2018-03-01 DIAGNOSIS — Z0000 Encounter for general adult medical examination without abnormal findings: Secondary | ICD-10-CM

## 2018-03-01 DIAGNOSIS — M25621 Stiffness of right elbow, not elsewhere classified: Secondary | ICD-10-CM | POA: Diagnosis not present

## 2018-03-01 DIAGNOSIS — Z1231 Encounter for screening mammogram for malignant neoplasm of breast: Secondary | ICD-10-CM

## 2018-03-01 NOTE — Progress Notes (Signed)
Patient ID: Bailey Perez, female   DOB: Sep 20, 1970, 47 y.o.   MRN: [id number]   Subjective:   CAMBRI Perez is a 47 y.o. female here for a complete physical exam   No concerns since last visit.   Mammogram noted asymmetry, normal biopsy completed and recommended routine screening.   Diet:  Not great, sometimes eats vegetables but most of the time eats what is quick and easy-  Popeyes; [employer]. Cut out burgers- because it causes digestive issues.  Drinks water most of the times; drinks Dr. Malachi Bonds when she is tired a few times a week.  Fruits- like various fruits; maybe eats 1-2 servings a week.   Exercise:  Some activity with work No routine physical activity   Breast Cancer Screening: Last mammogram 02/02/2017; 12/27 [place] of breast completed.  Cervical Cancer Screening: last pap smear 2018 next due 2021 Skin Cancer: discussed Osteoporosis: Takes supplements OTC- stopped for a while but plans to restart. Lung Cancer Screening: doses not qualify; never smoker STD testing: declines Hepatitis C screening: completed in 2018- negative  Vaccines: Tdap- next due 2022; Influenza is up to date.   Past Medical History:  Diagnosis Date  . ANA positive 09/09/2016   1:640 homogeneous pattern; refer to rheum  . Anemia    "hemoglobin E"  . 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 NASAL ENDOSCOPY     Dr. Clyde Canterbury   Family History  Problem Relation Age of Onset  . Cancer Paternal Aunt        breast  . Breast cancer Paternal Aunt   . Heart disease Neg Hx    Social History   Tobacco Use  . Smoking status: Never Smoker  . Smokeless tobacco: Never Used  Substance Use Topics  . Alcohol use: No    Alcohol/week: 0.0 standard drinks  . Drug use: No   Review of Systems  Objective:   Vitals:   03/01/18 1137  BP: 98/62  Pulse: 87  Temp: 97.6 F (36.4 C)  SpO2: 97%  Weight: 105 lb 4.8 oz (47.8 kg)  Height:  5\' 1"  (1.549 m)   Body mass index is 19.9 kg/m. Wt Readings from Last 3 Encounters:  03/01/18 105 lb 4.8 oz (47.8 kg)  02/21/17 95 lb 9.6 oz (43.4 kg)  10/04/16 93 lb (42.2 kg)   Physical Exam  Constitutional: She appears well-developed and well-nourished.  HENT:  Head: Normocephalic and atraumatic.  Right Ear: Hearing, tympanic membrane, external ear and ear canal normal.  Left Ear: Hearing, tympanic membrane, external ear and ear canal normal.  Eyes: Conjunctivae and EOM are normal. Right eye exhibits no hordeolum. Left eye exhibits no hordeolum. No scleral icterus.  Neck: Carotid bruit is not present. No thyromegaly present.  Cardiovascular: Normal rate, regular rhythm, S1 normal, S2 normal and normal heart sounds.  No extrasystoles are present.  Pulmonary/Chest: Effort normal and breath sounds normal. No respiratory distress. Right breast exhibits no inverted nipple, no mass, no nipple discharge, no skin change and no tenderness. Left breast exhibits no inverted nipple, no mass, no nipple discharge, no skin change and no tenderness. Breasts are symmetrical.  Abdominal: Soft. Normal appearance and bowel sounds are normal. She exhibits no distension, no abdominal bruit, no pulsatile midline mass and no mass. There is no hepatosplenomegaly. There is no tenderness. No hernia.  Musculoskeletal: She exhibits no edema.       Right elbow: She exhibits decreased range  of motion.  Lymphadenopathy:       Head (right side): No submandibular adenopathy present.       Head (left side): No submandibular adenopathy present.    She has no cervical adenopathy.    She has no axillary adenopathy.  Neurological: She is alert. She displays no tremor. No cranial nerve deficit. She exhibits normal muscle tone. Gait normal.  Reflex Scores:      Patellar reflexes are 2+ on the right side and 2+ on the left side. Skin: Skin is warm and dry. No bruising and no ecchymosis noted. No cyanosis. No pallor.   Psychiatric: Her speech is normal and behavior is normal. Thought content normal. Her mood appears not anxious. She does not exhibit a depressed mood.    Assessment/Plan:   Problem List Items Addressed This Visit      Other   Screening mammogram, encounter for    She is actually due now for her mammogram and I explained this to her; she declined and will go in December      Annual physical exam - Primary    USPSTF grade A and B recommendations reviewed with patient; age-appropriate recommendations, preventive care, screening tests, etc discussed and encouraged; healthy living encouraged; see AVS for patient education given to patient       Relevant Orders   CBC with Differential/Platelet   COMPLETE METABOLIC PANEL WITH GFR   Lipid panel   TSH    Other Visit Diagnoses    Decreased range of motion of elbow, right       offered referral to ortho; she declined; no pain, no other sx; she is welcome to call for referral if she changes her mind       No orders of the defined types were placed in this encounter.  Orders Placed This Encounter  Procedures  . CBC with Differential/Platelet  . COMPLETE METABOLIC PANEL WITH GFR  . Lipid panel  . TSH    Follow up plan: Return in about 1 year (around 03/02/2019) for complete physical.  An After Visit Summary was printed and given to the patient.

## 2018-03-01 NOTE — Assessment & Plan Note (Signed)
USPSTF grade A and B recommendations reviewed with patient; age-appropriate recommendations, preventive care, screening tests, etc discussed and encouraged; healthy living encouraged; see AVS for patient education given to patient  

## 2018-03-01 NOTE — Patient Instructions (Addendum)
General recommendations: 150 minutes of physical activity weekly, eat two servings of fish weekly, eat one serving of tree nuts ( cashews, pistachios, pecans, almonds.Marland Kitchen) every other day, eat 6 servings of fruit/vegetables daily and drink plenty of water and avoid sweet beverages.   Let us know if you would like a referral to see an orthopaedist about your arm  You are due NOW for your mammograms; call the breast center to move up your appointment if you choose  Health Maintenance, Female Adopting a healthy lifestyle and getting preventive care can go a long way to promote health and wellness. Talk with your health care provider about what schedule of regular examinations is right for you. This is a good chance for you to check in with your provider about disease prevention and staying healthy. In between checkups, there are plenty of things you can do on your own. Experts have done a lot of research about which lifestyle changes and preventive measures are most likely to keep you healthy. Ask your health care provider for more information. Weight and diet Eat a healthy diet  Be sure to include plenty of vegetables, fruits, low-fat dairy products, and lean protein.  Do not eat a lot of foods high in solid fats, added sugars, or salt.  Get regular exercise. This is one of the most important things you can do for your health. ? Most adults should exercise for at least 150 minutes each week. The exercise should increase your heart rate and make you sweat (moderate-intensity exercise). ? Most adults should also do strengthening exercises at least twice a week. This is in addition to the moderate-intensity exercise.  Maintain a healthy weight  Body mass index (BMI) is a measurement that can be used to identify possible weight problems. It estimates body fat based on height and weight. Your health care provider can help determine your BMI and help you achieve or maintain a healthy weight.  For  females 33 years of age and older: ? A BMI below 18.5 is considered underweight. ? A BMI of 18.5 to 24.9 is normal. ? A BMI of 25 to 29.9 is considered overweight. ? A BMI of 30 and above is considered obese.  Watch levels of cholesterol and blood lipids  You should start having your blood tested for lipids and cholesterol at 46 years of age, then have this test every 5 years.  You may need to have your cholesterol levels checked more often if: ? Your lipid or cholesterol levels are high. ? You are older than 46 years of age. ? You are at high risk for heart disease.  Cancer screening Lung Cancer  Lung cancer screening is recommended for adults 67-58 years old who are at high risk for lung cancer because of a history of smoking.  A yearly low-dose CT scan of the lungs is recommended for people who: ? Currently smoke. ? Have quit within the past 15 years. ? Have at least a 30-pack-year history of smoking. A pack year is smoking an average of one pack of cigarettes a day for 1 year.  Yearly screening should continue until it has been 15 years since you quit.  Yearly screening should stop if you develop a health problem that would prevent you from having lung cancer treatment.  Breast Cancer  Practice breast self-awareness. This means understanding how your breasts normally appear and feel.  It also means doing regular breast self-exams. Let your health care provider know about any changes, no  matter how small.  If you are in your 20s or 30s, you should have a clinical breast exam (CBE) by a health care provider every 1-3 years as part of a regular health exam.  If you are 10 or older, have a CBE every year. Also consider having a breast X-ray (mammogram) every year.  If you have a family history of breast cancer, talk to your health care provider about genetic screening.  If you are at high risk for breast cancer, talk to your health care provider about having an MRI and a  mammogram every year.  Breast cancer gene (BRCA) assessment is recommended for women who have family members with BRCA-related cancers. BRCA-related cancers include: ? Breast. ? Ovarian. ? Tubal. ? Peritoneal cancers.  Results of the assessment will determine the need for genetic counseling and BRCA1 and BRCA2 testing.  Cervical Cancer Your health care provider may recommend that you be screened regularly for cancer of the pelvic organs (ovaries, uterus, and vagina). This screening involves a pelvic examination, including checking for microscopic changes to the surface of your cervix (Pap test). You may be encouraged to have this screening done every 3 years, beginning at age 41.  For women ages 5-65, health care providers may recommend pelvic exams and Pap testing every 3 years, or they may recommend the Pap and pelvic exam, combined with testing for human papilloma virus (HPV), every 5 years. Some types of HPV increase your risk of cervical cancer. Testing for HPV may also be done on women of any age with unclear Pap test results.  Other health care providers may not recommend any screening for nonpregnant women who are considered low risk for pelvic cancer and who do not have symptoms. Ask your health care provider if a screening pelvic exam is right for you.  If you have had past treatment for cervical cancer or a condition that could lead to cancer, you need Pap tests and screening for cancer for at least 20 years after your treatment. If Pap tests have been discontinued, your risk factors (such as having a new sexual partner) need to be reassessed to determine if screening should resume. Some women have medical problems that increase the chance of getting cervical cancer. In these cases, your health care provider may recommend more frequent screening and Pap tests.  Colorectal Cancer  This type of cancer can be detected and often prevented.  Routine colorectal cancer screening usually  begins at 46 years of age and continues through 46 years of age.  Your health care provider may recommend screening at an earlier age if you have risk factors for colon cancer.  Your health care provider may also recommend using home test kits to check for hidden blood in the stool.  A small camera at the end of a tube can be used to examine your colon directly (sigmoidoscopy or colonoscopy). This is done to check for the earliest forms of colorectal cancer.  Routine screening usually begins at age 64.  Direct examination of the colon should be repeated every 5-10 years through 46 years of age. However, you may need to be screened more often if early forms of precancerous polyps or small growths are found.  Skin Cancer  Check your skin from head to toe regularly.  Tell your health care provider about any new moles or changes in moles, especially if there is a change in a mole's shape or color.  Also tell your health care provider if you have  a mole that is larger than the size of a pencil eraser.  Always use sunscreen. Apply sunscreen liberally and repeatedly throughout the day.  Protect yourself by wearing long sleeves, pants, a wide-brimmed hat, and sunglasses whenever you are outside.  Heart disease, diabetes, and high blood pressure  High blood pressure causes heart disease and increases the risk of stroke. High blood pressure is more likely to develop in: ? People who have blood pressure in the high end of the normal range (130-139/85-89 mm Hg). ? People who are overweight or obese. ? People who are African American.  If you are 74-59 years of age, have your blood pressure checked every 3-5 years. If you are 56 years of age or older, have your blood pressure checked every year. You should have your blood pressure measured twice-once when you are at a hospital or clinic, and once when you are not at a hospital or clinic. Record the average of the two measurements. To check your  blood pressure when you are not at a hospital or clinic, you can use: ? An automated blood pressure machine at a pharmacy. ? A home blood pressure monitor.  If you are between 55 years and 90 years old, ask your health care provider if you should take aspirin to prevent strokes.  Have regular diabetes screenings. This involves taking a blood sample to check your fasting blood sugar level. ? If you are at a normal weight and have a low risk for diabetes, have this test once every three years after 46 years of age. ? If you are overweight and have a high risk for diabetes, consider being tested at a younger age or more often. Preventing infection Hepatitis B  If you have a higher risk for hepatitis B, you should be screened for this virus. You are considered at high risk for hepatitis B if: ? You were born in a country where hepatitis B is common. Ask your health care provider which countries are considered high risk. ? Your parents were born in a high-risk country, and you have not been immunized against hepatitis B (hepatitis B vaccine). ? You have HIV or AIDS. ? You use needles to inject street drugs. ? You live with someone who has hepatitis B. ? You have had sex with someone who has hepatitis B. ? You get hemodialysis treatment. ? You take certain medicines for conditions, including cancer, organ transplantation, and autoimmune conditions.  Hepatitis C  Blood testing is recommended for: ? Everyone born from [birth year] through 1964. ? Anyone with known risk factors for hepatitis C.  Sexually transmitted infections (STIs)  You should be screened for sexually transmitted infections (STIs) including gonorrhea and chlamydia if: ? You are sexually active and are younger than 46 years of age. ? You are older than 46 years of age and your health care provider tells you that you are at risk for this type of infection. ? Your sexual activity has changed since you were last screened and you are at  an increased risk for chlamydia or gonorrhea. Ask your health care provider if you are at risk.  If you do not have HIV, but are at risk, it may be recommended that you take a prescription medicine daily to prevent HIV infection. This is called pre-exposure prophylaxis (PrEP). You are considered at risk if: ? You are sexually active and do not regularly use condoms or know the HIV status of your partner(s). ? You take drugs by injection. ?  You are sexually active with a partner who has HIV.  Talk with your health care provider about whether you are at high risk of being infected with HIV. If you choose to begin PrEP, you should first be tested for HIV. You should then be tested every 3 months for as long as you are taking PrEP. Pregnancy  If you are premenopausal and you may become pregnant, ask your health care provider about preconception counseling.  If you may become pregnant, take 400 to 800 micrograms (mcg) of folic acid every day.  If you want to prevent pregnancy, talk to your health care provider about birth control (contraception). Osteoporosis and menopause  Osteoporosis is a disease in which the bones lose minerals and strength with aging. This can result in serious bone fractures. Your risk for osteoporosis can be identified using a bone density scan.  If you are 14 years of age or older, or if you are at risk for osteoporosis and fractures, ask your health care provider if you should be screened.  Ask your health care provider whether you should take a calcium or vitamin D supplement to lower your risk for osteoporosis.  Menopause may have certain physical symptoms and risks.  Hormone replacement therapy may reduce some of these symptoms and risks. Talk to your health care provider about whether hormone replacement therapy is right for you. Follow these instructions at home:  Schedule regular health, dental, and eye exams.  Stay current with your immunizations.  Do not  use any tobacco products including cigarettes, chewing tobacco, or electronic cigarettes.  If you are pregnant, do not drink alcohol.  If you are breastfeeding, limit how much and how often you drink alcohol.  Limit alcohol intake to no more than 1 drink per day for nonpregnant women. One drink equals 12 ounces of beer, 5 ounces of wine, or 1 ounces of hard liquor.  Do not use street drugs.  Do not share needles.  Ask your health care provider for help if you need support or information about quitting drugs.  Tell your health care provider if you often feel depressed.  Tell your health care provider if you have ever been abused or do not feel safe at home. This information is not intended to replace advice given to you by your health care provider. Make sure you discuss any questions you have with your health care provider. Document Released: 10/24/2010 Document Revised: 09/16/2015 Document Reviewed: 01/12/2015 Elsevier Interactive Patient Education  [employer].

## 2018-03-01 NOTE — Assessment & Plan Note (Signed)
She is actually due now for her mammogram and I explained this to her; she declined and will go in December

## 2018-03-02 LAB — COMPLETE METABOLIC PANEL WITH GFR
AG RATIO: 0.9 (calc) — AB (ref 1.0–2.5)
ALT: 11 U/L (ref 6–29)
AST: 13 U/L (ref 10–35)
Albumin: 3.6 g/dL (ref 3.6–5.1)
Alkaline phosphatase (APISO): 76 U/L (ref 33–115)
BILIRUBIN TOTAL: 0.7 mg/dL (ref 0.2–1.2)
BUN: 10 mg/dL (ref 7–25)
CHLORIDE: 100 mmol/L (ref 98–110)
CO2: 29 mmol/L (ref 20–32)
Calcium: 9.1 mg/dL (ref 8.6–10.2)
Creat: 0.53 mg/dL (ref 0.50–1.10)
GFR, EST AFRICAN AMERICAN: 132 mL/min/{1.73_m2} (ref >=60)
GFR, Est Non African American: 114 mL/min/{1.73_m2} (ref >=60)
GLUCOSE: 79 mg/dL (ref 65–99)
Globulin: 4.1 g/dL (calc) — ABNORMAL HIGH (ref 1.9–3.7)
Potassium: 4.1 mmol/L (ref 3.5–5.3)
Sodium: 136 mmol/L (ref 135–146)
TOTAL PROTEIN: 7.7 g/dL (ref 6.1–8.1)

## 2018-03-02 LAB — CBC WITH DIFFERENTIAL/PLATELET
BASOS ABS: 18 {cells}/uL (ref 0–200)
BASOS PCT: 0.3 %
EOS ABS: 83 {cells}/uL (ref 15–500)
Eosinophils Relative: 1.4 %
HCT: 35.8 % (ref 35.0–45.0)
Hemoglobin: 11.5 g/dL — ABNORMAL LOW (ref 11.7–15.5)
Lymphs Abs: 2012 cells/uL (ref 850–3900)
MCH: 23.6 pg — AB (ref 27.0–33.0)
MCHC: 32.1 g/dL (ref 32.0–36.0)
MCV: 73.5 fL — AB (ref 80.0–100.0)
MPV: 10.5 fL (ref 7.5–12.5)
Monocytes Relative: 7.3 %
NEUTROS PCT: 56.9 %
Neutro Abs: 3357 cells/uL (ref 1500–7800)
Platelets: 573 10*3/uL — ABNORMAL HIGH (ref 140–400)
RBC: 4.87 10*6/uL (ref 3.80–5.10)
RDW: 15.7 % — AB (ref 11.0–15.0)
TOTAL LYMPHOCYTE: 34.1 %
WBC: 5.9 10*3/uL (ref 3.8–10.8)
WBCMIX: 431 {cells}/uL (ref 200–950)

## 2018-03-02 LAB — LIPID PANEL
CHOL/HDL RATIO: 2.5 (calc) (ref <5.0)
Cholesterol: 157 mg/dL (ref <200)
HDL: 62 mg/dL (ref >50)
LDL Cholesterol (Calc): 82 mg/dL (calc)
NON-HDL CHOLESTEROL (CALC): 95 mg/dL (ref <130)
Triglycerides: 44 mg/dL (ref <150)

## 2018-03-02 LAB — TSH: TSH: 2.12 m[IU]/L

## 2018-03-03 ENCOUNTER — Other Ambulatory Visit: Payer: Self-pay | Admitting: Family Medicine

## 2018-03-03 DIAGNOSIS — D582 Other hemoglobinopathies: Secondary | ICD-10-CM

## 2018-03-03 DIAGNOSIS — D75839 Thrombocytosis, unspecified: Secondary | ICD-10-CM

## 2018-03-03 DIAGNOSIS — D473 Essential (hemorrhagic) thrombocythemia: Secondary | ICD-10-CM

## 2018-03-03 NOTE — Progress Notes (Signed)
  Refer to heme

## 2018-03-15 ENCOUNTER — Encounter: Payer: Self-pay | Admitting: Hematology and Oncology

## 2018-09-20 IMAGING — US US BREAST*R* LIMITED INC AXILLA
1 series · 10 of 10 positions shown · non-contrast
Comparison: Previous exam(s).

CLINICAL DATA: Right superior breast asymmetry seen on most recent
screening mammography.

EXAM:
2D DIGITAL DIAGNOSTIC RIGHT MAMMOGRAM WITH CAD AND ADJUNCT TOMO
ULTRASOUND RIGHT BREAST

[Series 1: us breast*right* limited inc axilla · 0.06mm/px · 10 of 10 slices shown]
[im 1/10]
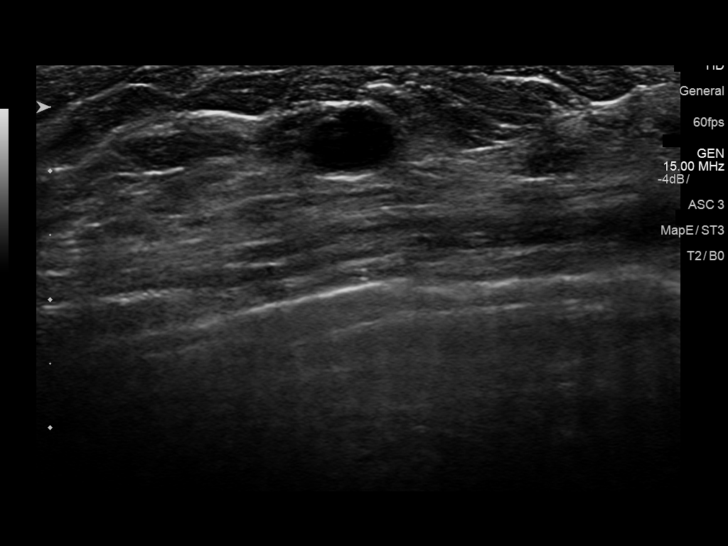
[im 2/10]
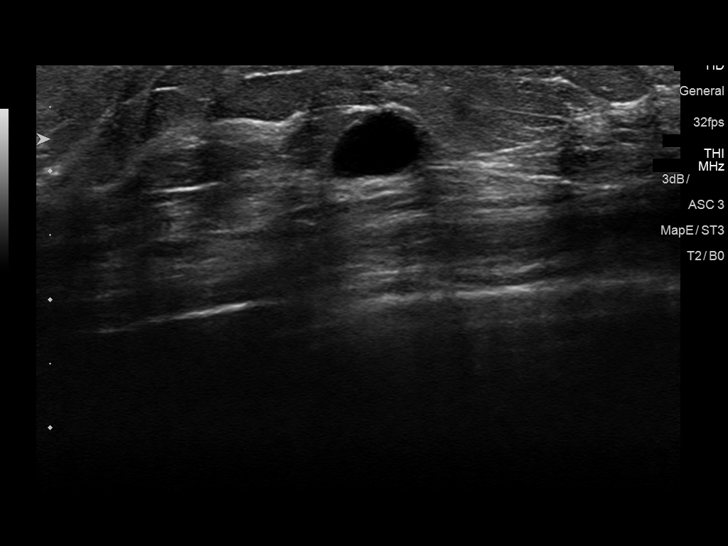
[im 3/10]
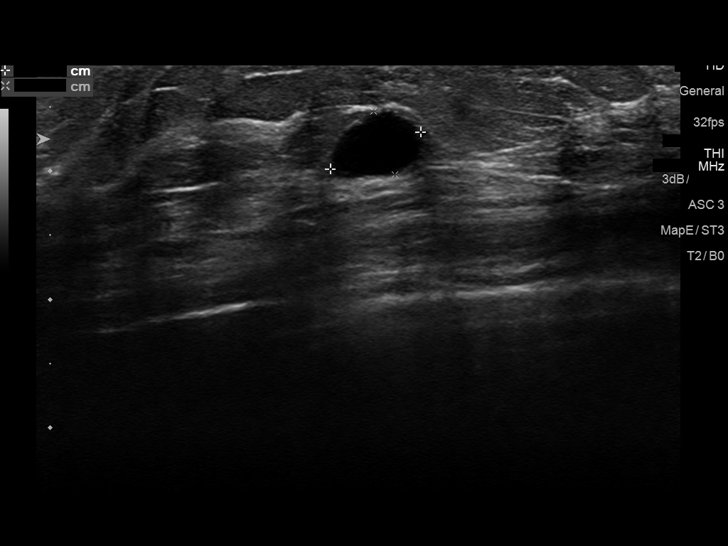
[im 4/10]
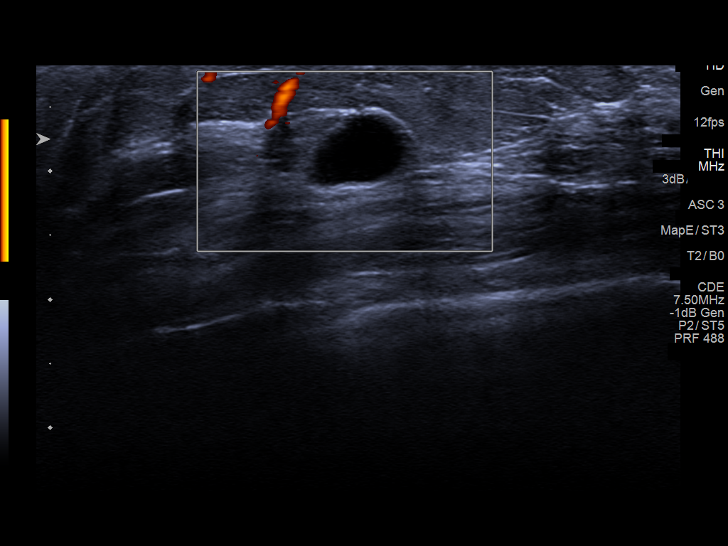
[im 5/10]
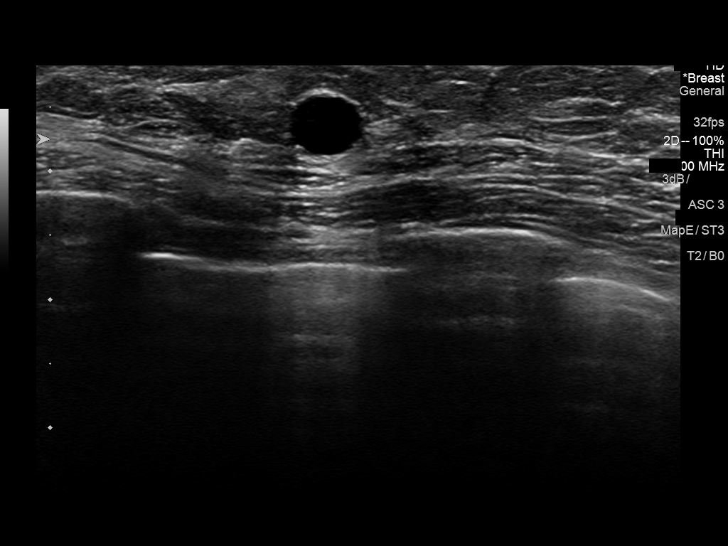
[im 6/10]
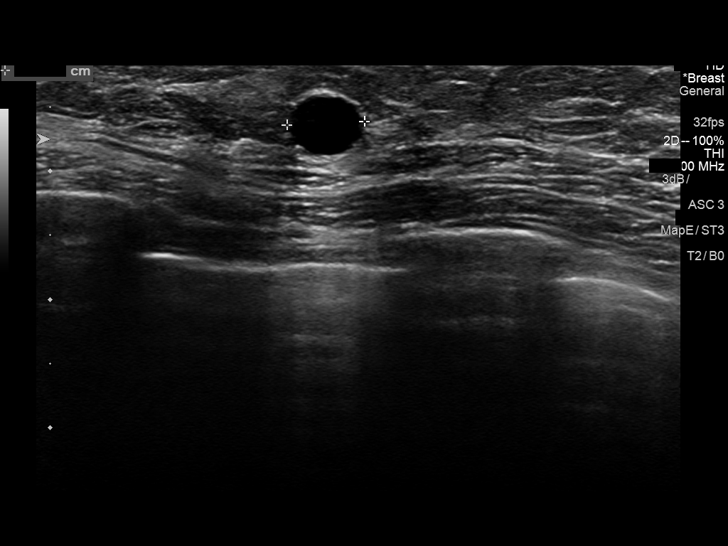
[im 7/10]
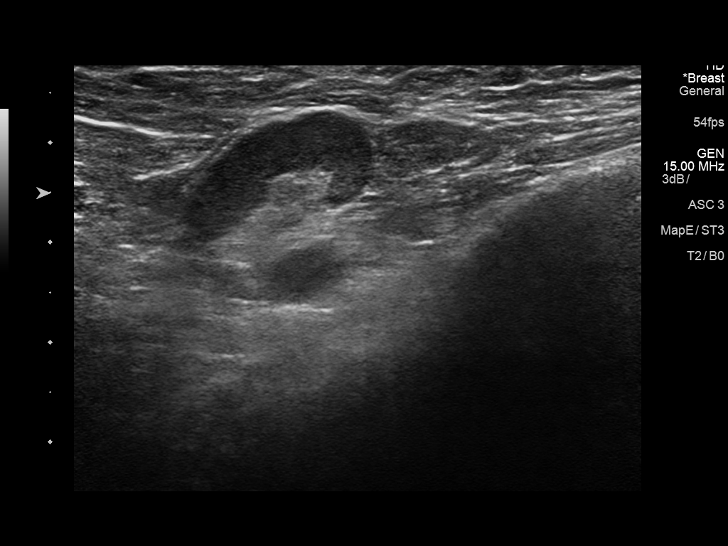
[im 8/10]
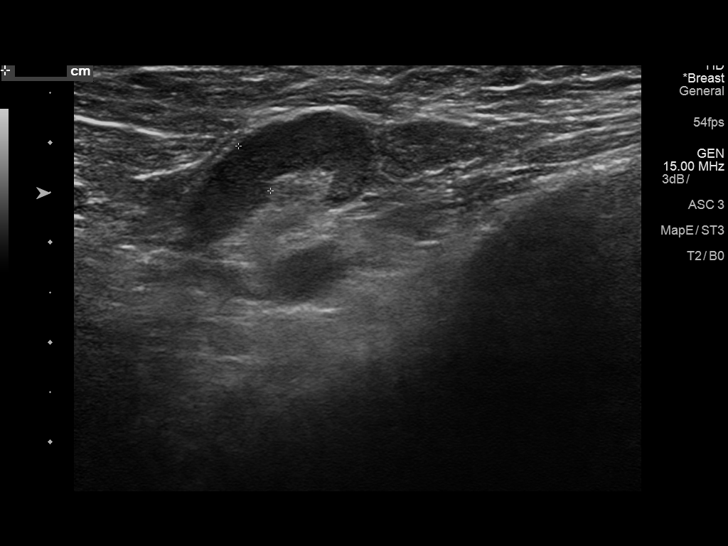
[im 9/10]
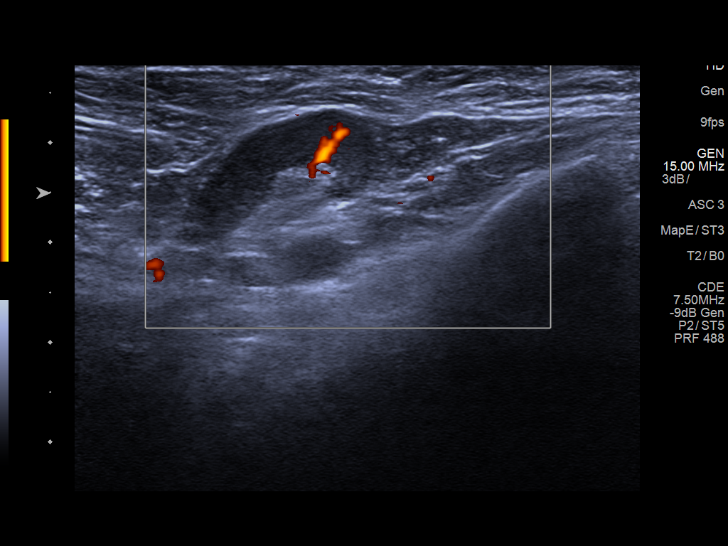
[im 10/10]
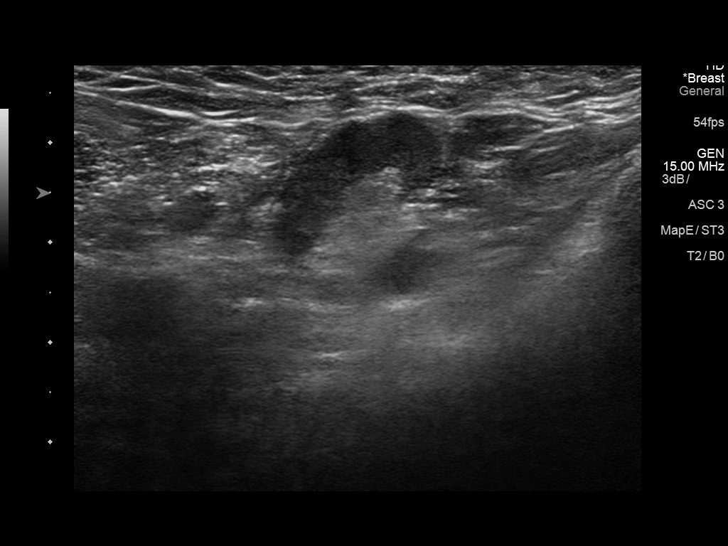

[10 of 10 positions shown; findings below may reference images not displayed]

ACR Breast Density Category d: The breast tissue is extremely dense,
which lowers the sensitivity of mammography.
FINDINGS: Mammographically, there is a persistent 8 mm circumscribed high
density nodule in the right superior breast, far posterior depth,
located in the upper outer quadrant, or lower axilla, according to
tomosynthesis images.

Mammographic images were processed with CAD.

On physical exam, no suspicious masses are palpated.

Targeted ultrasound is performed, showing right breast 10 o'clock 8
cm from the nipple hypoechoic to anechoic circumscribed horizontally
oriented nodule measuring 0.8 x 0.5 x 0.6 cm. This nodule likely
corresponds to the mammographically seen abnormality. In the right
axilla, there is a lymph node with thickened cortex of up to 6 mm
and intracortical blood flow. A lymph node measures 2.1 cm in
long-axis.
IMPRESSION: Right breast 10 o'clock 8 mm indeterminate nodule.

Right axillary lymph node with prominent cortical thickening.

RECOMMENDATION:
Ultrasound-guided core needle biopsy for both findings is
recommended.

I have discussed the findings and recommendations with the patient.
Results were also provided in writing at the conclusion of the
visit. If applicable, a reminder letter will be sent to the patient
regarding the next appointment.

BI-RADS CATEGORY  4: Suspicious.

## 2019-03-11 ENCOUNTER — Other Ambulatory Visit: Payer: Self-pay

## 2019-03-11 ENCOUNTER — Encounter: Payer: Self-pay | Admitting: Family Medicine

## 2019-03-11 ENCOUNTER — Ambulatory Visit (INDEPENDENT_AMBULATORY_CARE_PROVIDER_SITE_OTHER): Payer: 59 | Admitting: Family Medicine

## 2019-03-11 VITALS — BP 110/64 | HR 92 | Temp 97.3°F | Resp 14 | Ht 61.0 in | Wt 101.9 lb

## 2019-03-11 DIAGNOSIS — R718 Other abnormality of red blood cells: Secondary | ICD-10-CM

## 2019-03-11 DIAGNOSIS — Z0000 Encounter for general adult medical examination without abnormal findings: Secondary | ICD-10-CM

## 2019-03-11 DIAGNOSIS — Z1231 Encounter for screening mammogram for malignant neoplasm of breast: Secondary | ICD-10-CM

## 2019-03-11 DIAGNOSIS — Z131 Encounter for screening for diabetes mellitus: Secondary | ICD-10-CM

## 2019-03-11 DIAGNOSIS — Z13220 Encounter for screening for lipoid disorders: Secondary | ICD-10-CM

## 2019-03-11 DIAGNOSIS — Z862 Personal history of diseases of the blood and blood-forming organs and certain disorders involving the immune mechanism: Secondary | ICD-10-CM | POA: Diagnosis not present

## 2019-03-11 LAB — CBC WITH DIFFERENTIAL/PLATELET
Absolute Monocytes: 406 cells/uL (ref 200–950)
Basophils Absolute: 41 cells/uL (ref 0–200)
Basophils Relative: 0.7 %
Eosinophils Absolute: 139 cells/uL (ref 15–500)
Eosinophils Relative: 2.4 %
HCT: 38.3 % (ref 35.0–45.0)
Hemoglobin: 11.9 g/dL (ref 11.7–15.5)
Lymphs Abs: 2071 cells/uL (ref 850–3900)
MCH: 24 pg — ABNORMAL LOW (ref 27.0–33.0)
MCHC: 31.1 g/dL — ABNORMAL LOW (ref 32.0–36.0)
MCV: 77.2 fL — ABNORMAL LOW (ref 80.0–100.0)
MPV: 9.8 fL (ref 7.5–12.5)
Monocytes Relative: 7 %
Neutro Abs: 3144 cells/uL (ref 1500–7800)
Neutrophils Relative %: 54.2 %
Platelets: 486 10*3/uL — ABNORMAL HIGH (ref 140–400)
RBC: 4.96 10*6/uL (ref 3.80–5.10)
RDW: 14.6 % (ref 11.0–15.0)
Total Lymphocyte: 35.7 %
WBC: 5.8 10*3/uL (ref 3.8–10.8)

## 2019-03-11 LAB — LIPID PANEL
Cholesterol: 167 mg/dL (ref <200)
HDL: 64 mg/dL (ref >=50)
LDL Cholesterol (Calc): 88 mg/dL (calc)
Non-HDL Cholesterol (Calc): 103 mg/dL (calc) (ref <130)
Total CHOL/HDL Ratio: 2.6 (calc) (ref <5.0)
Triglycerides: 64 mg/dL (ref <150)

## 2019-03-11 LAB — COMPLETE METABOLIC PANEL WITH GFR
AG Ratio: 1.2 (calc) (ref 1.0–2.5)
ALT: 6 U/L (ref 6–29)
AST: 12 U/L (ref 10–35)
Albumin: 4 g/dL (ref 3.6–5.1)
Alkaline phosphatase (APISO): 68 U/L (ref 31–125)
BUN: 8 mg/dL (ref 7–25)
CO2: 23 mmol/L (ref 20–32)
Calcium: 9.2 mg/dL (ref 8.6–10.2)
Chloride: 104 mmol/L (ref 98–110)
Creat: 0.65 mg/dL (ref 0.50–1.10)
GFR, Est African American: 123 mL/min/{1.73_m2} (ref >=60)
GFR, Est Non African American: 106 mL/min/{1.73_m2} (ref >=60)
Globulin: 3.4 g/dL (calc) (ref 1.9–3.7)
Glucose, Bld: 86 mg/dL (ref 65–99)
Potassium: 4.6 mmol/L (ref 3.5–5.3)
Sodium: 138 mmol/L (ref 135–146)
Total Bilirubin: 0.7 mg/dL (ref 0.2–1.2)
Total Protein: 7.4 g/dL (ref 6.1–8.1)

## 2019-03-11 NOTE — Progress Notes (Signed)
Name: Bailey Perez   MRN: [id number]    DOB: 07/27/1970   Date:03/11/2019       Progress Note  Subjective  Chief Complaint  Annual Physical   HPI  Patient presents for annual CPE.  Diet: She tries to keep up with eating on a routine with healthy foods. When eating meats and fatty foods she feels pain in her joints and then uses a cleansing detox tea. This has helped greatly with muscle spasms.  Exercise: Does not have an exercise routine, moves around frequently and walks some times.    USPSTF grade A and B recommendations    Office Visit from 03/11/2019 in [facility]  AUDIT-C Score  0     Depression: Phq 9 is  negative Depression screen [facility] 2/9 03/11/2019 03/01/2018 03/01/2018 02/21/2017 09/07/2016  Decreased Interest 0 0 0 1 0  Down, Depressed, Hopeless 0 0 0 0 0  PHQ - 2 Score 0 0 0 1 0  Altered sleeping 0 0 - 2 -  Tired, decreased energy 0 0 - 2 -  Change in appetite 0 0 - 0 -  Feeling bad or failure about yourself  0 0 - 0 -  Trouble concentrating 0 0 - 0 -  Moving slowly or fidgety/restless 0 0 - 0 -  Suicidal thoughts 0 0 - 0 -  PHQ-9 Score 0 0 - 5 -  Difficult doing work/chores Not difficult at all Not difficult at all - Somewhat difficult -   Hypertension: BP Readings from Last 3 Encounters:  03/11/19 110/64  03/01/18 98/62  02/21/17 102/60   Obesity: Wt Readings from Last 3 Encounters:  03/11/19 46.2 kg  03/01/18 47.8 kg  02/21/17 43.4 kg   BMI Readings from Last 3 Encounters:  03/11/19 19.25 kg/m  03/01/18 19.90 kg/m  02/21/17 17.49 kg/m     Hep C Screening: previously completed. STD testing and prevention (HIV/chl/gon/syphilis): no concern at this time Intimate partner violence: denies Sexual History/Pain during Intercourse: once in a while there is pain but not frequent Menstrual History/LMP/Abnormal Bleeding: Denies concerns for bleeding  Incontinence Symptoms: Denies concerns   Breast cancer:  - Last  Mammogram: 03/09/2017- she will make an appointment. - BRCA gene screening: Unsure but possibly her aunt on her fathers side.   Osteoporosis Screening: using calcium supplements, she denies recent screening. She will consider screening next year 2021.  Cervical cancer screening: pap completed in 2018  Skin cancer: denies concern  Colorectal cancer: Denies family or personal history of colorectal cancer, no changes in BM's - no blood in stool, dark and tarry stool, mucus in stool, or constipation/diarrhea.  Discuss new CRC screening recommendations by USPST for 47yo+ - she declines colonoscopy or cologuard.  Lung cancer:   Non-smoker. Low Dose CT Chest recommended if Age 87-80 years, 30 pack-year currently smoking OR have quit w/in 15years. Patient does not qualify.   ECG: last ekg 2017  Advanced Care Planning: A voluntary discussion about advance care planning including the explanation and discussion of advance directives.  Discussed health care proxy and Living will, and the patient was able to identify a health care proxy as.  Patient does not know have a living will at present time. If patient does have living will, I have requested they bring this to the clinic to be scanned in to their chart.  Lipids: Lab Results  Component Value Date   CHOL 157 03/01/2018   CHOL 135 02/21/2017   CHOL 158 01/17/2016  Lab Results  Component Value Date   HDL 62 03/01/2018   HDL 60 02/21/2017   HDL 54 01/17/2016   Lab Results  Component Value Date   LDLCALC 82 03/01/2018   LDLCALC 61 02/21/2017   LDLCALC 90 01/17/2016   Lab Results  Component Value Date   TRIG 44 03/01/2018   TRIG 68 02/21/2017   TRIG 72 01/17/2016   Lab Results  Component Value Date   CHOLHDL 2.5 03/01/2018   CHOLHDL 2.3 02/21/2017   CHOLHDL 2.9 01/17/2016   No results found for: LDLDIRECT  Glucose: Glucose, Bld  Date Value Ref Range Status  03/01/2018 79 65 - 99 mg/dL Final    Comment:    .            Fasting  reference interval .   02/21/2017 83 65 - 99 mg/dL Final    Comment:    .            Fasting reference interval .   09/07/2016 77 65 - 99 mg/dL Final    Patient Active Problem List   Diagnosis Date Noted  . Axillary lymphadenopathy 03/09/2017  . History of anemia 02/21/2017  . ANA positive 09/09/2016  . Abnormal weight loss 09/09/2016  . 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 Joint pain 09/07/2016  . Dysphagia 09/07/2016  . Screening mammogram, encounter for 01/26/2016  . Microcytosis 01/18/2016  . Arthritis 12/24/2014  . History of migraine headaches 12/24/2014  . Annual physical exam 12/24/2014  . Other fatigue 12/24/2014    Past Surgical History:  Procedure Laterality Date  . NASAL ENDOSCOPY     Dr. Clyde Canterbury    Family History  Problem Relation Age of Onset  . Cancer Paternal Aunt        breast  . Breast cancer Paternal Aunt   . Heart disease Neg Hx     Social History   Socioeconomic History  . Marital status: Married    Spouse name: amph   . Number of children: 3  . Years of education: 64  . Highest education level: Some college, no degree  Occupational History  . Not on file  Social Needs  . Financial resource strain: Not hard at all  . Food insecurity    Worry: Never true    Inability: Never true  . Transportation needs    Medical: No    Non-medical: No  Tobacco Use  . Smoking status: Never Smoker  . Smokeless tobacco: Never Used  Substance and Sexual Activity  . Alcohol use: No    Alcohol/week: 0.0 standard drinks  . Drug use: No  . Sexual activity: Yes    Partners: Male    Birth control/protection: Rhythm  Lifestyle  . Physical activity    Days per week: 0 days    Minutes per session: 0 min  . Stress: Not at all  Relationships  . Social connections    Talks on phone: More than three times a week    Gets together: Never    Attends religious service: More than 4 times per year    Active member of club or organization: No     Attends meetings of clubs or organizations: Never    Relationship status: Married  . Intimate partner violence    Fear of current or ex partner: No    Emotionally abused: No    Physically abused: No    Forced sexual activity: No  Other Topics Concern  . Not on  file  Social History Narrative  . Not on file     Current Outpatient Medications:  .  BLACK CURRANT SEED OIL PO, Take by mouth., Disp: , Rfl:  .  calcium carbonate 1250 MG capsule, Take 1,250 mg by mouth 2 (two) times daily with a meal., Disp: , Rfl:  .  Echinacea 400 MG CAPS, Take 400 mg by mouth daily., Disp: , Rfl:  .  Omega-3 Fatty Acids (FISH OIL) 1000 MG CAPS, Take by mouth., Disp: , Rfl:  .  SUPER B COMPLEX/C PO, Take 1 capsule by mouth daily., Disp: , Rfl:  .  TURMERIC PO, Take by mouth., Disp: , Rfl:   No Known Allergies   Review of Systems  Constitutional: Negative for fever and weight loss.  HENT: Negative.   Respiratory: Negative.   Cardiovascular: Negative.   Gastrointestinal: Negative.   Genitourinary: Negative.   Musculoskeletal: Positive for joint pain.  Neurological: Negative.   Psychiatric/Behavioral: Negative.   All other systems reviewed and are negative.   Objective  Vitals:   03/11/19 0946  BP: 110/64  Pulse: 92  Resp: 14  Temp: (!) 97.3 F (36.3 C)  TempSrc: Temporal  Weight: 46.2 kg  Height: 5' 1"  (1.549 m)    Body mass index is 19.25 kg/m.  Physical Exam: Constitutional: Patient appears well-developed and well-nourished. No distress.  HENT: Head: Normocephalic and atraumatic. Ears: B TMs ok, no erythema or effusion; Nose: Nose normal. Mouth/Throat: Oropharynx is clear and moist. No oropharyngeal exudate.  Eyes: Conjunctivae and EOM are normal. Pupils are equal, round, and reactive to light. No scleral icterus.  Neck: Normal range of motion. Neck supple. No JVD present. No thyromegaly present.  Cardiovascular: Normal rate, regular rhythm and normal heart sounds.  No murmur  heard. No BLE edema. Pulmonary/Chest: Effort normal and breath sounds normal. No respiratory distress. Abdominal: Soft. Bowel sounds are normal, no distension. There is no tenderness. no masses Breast: no lumps or masses, no nipple discharge or rashes FEMALE GENITALIA: Deferred Musculoskeletal: Normal range of motion, no joint effusions. No gross deformities Neurological: he is alert and oriented to person, place, and time. No cranial nerve deficit. Coordination, balance, strength, speech and gait are normal.  Skin: Skin is warm and dry. No rash noted. No erythema.  Psychiatric: Patient has a normal mood and affect. behavior is normal. Judgment and thought content normal.  Fall Risk: Fall Risk  03/11/2019 03/01/2018 09/07/2016 01/17/2016 12/24/2014  Falls in the past year? 0 0 No No No  Number falls in past yr: 0 - - - -  Injury with Fall? 0 - - - -  Follow up Falls evaluation completed - - - -   Assessment & Plan  1. Annual physical exam -USPSTF grade A and B recommendations reviewed with patient; age-appropriate recommendations, preventive care, screening tests, etc discussed and encouraged; healthy living encouraged; see AVS for patient education given to patient -Discussed importance of 150 minutes of physical activity weekly, eat two servings of fish weekly, eat one serving of tree nuts ( cashews, pistachios, pecans, almonds.Marland Kitchen) every other day, eat 6 servings of fruit/vegetables daily and drink plenty of water and avoid sweet beverages.  - COMPLETE METABOLIC PANEL WITH GFR - CBC w/Diff/Platelet - Lipid Profile  2. History of anemia - CBC w/Diff/Platelet  3. Screening mammogram, encounter for - MM 3D SCREEN BREAST BILATERAL; Future  4. Microcytosis - CBC w/Diff/Platelet  5. Lipid screening - Lipid Profile  6. Diabetes mellitus screening - COMPLETE METABOLIC PANEL  WITH GFR

## 2019-03-12 ENCOUNTER — Other Ambulatory Visit: Payer: Self-pay | Admitting: Family Medicine

## 2019-03-12 DIAGNOSIS — R718 Other abnormality of red blood cells: Secondary | ICD-10-CM

## 2019-03-12 DIAGNOSIS — D582 Other hemoglobinopathies: Secondary | ICD-10-CM

## 2020-03-24 ENCOUNTER — Other Ambulatory Visit: Payer: Self-pay

## 2020-03-24 ENCOUNTER — Encounter: Payer: Self-pay | Admitting: Family Medicine

## 2020-03-24 ENCOUNTER — Ambulatory Visit (INDEPENDENT_AMBULATORY_CARE_PROVIDER_SITE_OTHER): Payer: 59 | Admitting: Family Medicine

## 2020-03-24 VITALS — BP 110/72 | HR 84 | Temp 98.1°F | Resp 16 | Ht 61.0 in | Wt 112.7 lb

## 2020-03-24 DIAGNOSIS — Z1231 Encounter for screening mammogram for malignant neoplasm of breast: Secondary | ICD-10-CM | POA: Diagnosis not present

## 2020-03-24 DIAGNOSIS — Z1211 Encounter for screening for malignant neoplasm of colon: Secondary | ICD-10-CM

## 2020-03-24 DIAGNOSIS — Z124 Encounter for screening for malignant neoplasm of cervix: Secondary | ICD-10-CM

## 2020-03-24 DIAGNOSIS — Z0000 Encounter for general adult medical examination without abnormal findings: Secondary | ICD-10-CM

## 2020-03-24 DIAGNOSIS — D582 Other hemoglobinopathies: Secondary | ICD-10-CM

## 2020-03-24 DIAGNOSIS — R059 Cough, unspecified: Secondary | ICD-10-CM | POA: Diagnosis not present

## 2020-03-24 DIAGNOSIS — J309 Allergic rhinitis, unspecified: Secondary | ICD-10-CM

## 2020-03-24 LAB — CBC WITH DIFFERENTIAL/PLATELET
Basophils Relative: 0.4 %
Hemoglobin: 12.5 g/dL (ref 11.7–15.5)
MCHC: 31.8 g/dL — ABNORMAL LOW (ref 32.0–36.0)
WBC: 7.6 10*3/uL (ref 3.8–10.8)

## 2020-03-24 NOTE — Patient Instructions (Addendum)
Health Maintenance  Topic Date Due  . Mammogram  02/02/2018  . Pap Smear  02/22/2020  . Tetanus Vaccine  11/09/2020  . Flu Shot  Completed  . COVID-19 Vaccine  Completed  .  Hepatitis C: One time screening is recommended by Center for Disease Control  (CDC) for  adults born from [birth year] through 1964.   Completed  . HIV Screening  Completed   The American Cancer Society (ACS) recommends that individuals with a cervix initiate cervical cancer screening at age 48 years and undergo primary human papillomavirus (HPV) testing every 5 years through age 97 years (preferred); if primary HPV testing is not available, then individuals aged 15 to 5 years should be screened with cotesting (HPV testing in combination with cytology) every 5 years or cytology alone every 3 years (acceptable) (strong recommendation) Recommend doing the HPV testing by 02/21/2022 - per ACS recommendations    Call to schedule your mammogram  [facility] at [place],  [place]  [address] Get Driving Directions Main: [phone]     Preventive Care 31-54 Years Old, Female Preventive care refers to visits with your health care provider and lifestyle choices that can promote health and wellness. This includes:  A yearly physical exam. This may also be called an annual well check.  Regular dental visits and eye exams.  Immunizations.  Screening for certain conditions.  Healthy lifestyle choices, such as eating a healthy diet, getting regular exercise, not using drugs or products that contain nicotine and tobacco, and limiting alcohol use. What can I expect for my preventive care visit? Physical exam Your health care provider will check your:  Height and weight. This may be used to calculate body mass index (BMI), which tells if you are at a healthy weight.  Heart rate and blood pressure.  Skin for abnormal spots. Counseling Your health care provider may ask you  questions about your:  Alcohol, tobacco, and drug use.  Emotional well-being.  Home and relationship well-being.  Sexual activity.  Eating habits.  Work and work Statistician.  Method of birth control.  Menstrual cycle.  Pregnancy history. What immunizations do I need?  Influenza (flu) vaccine  This is recommended every year. Tetanus, diphtheria, and pertussis (Tdap) vaccine  You may need a Td booster every 10 years. Varicella (chickenpox) vaccine  You may need this if you have not been vaccinated. Zoster (shingles) vaccine  You may need this after age 1. Measles, mumps, and rubella (MMR) vaccine  You may need at least one dose of MMR if you were born in 1957 or later. You may also need a second dose. Pneumococcal conjugate (PCV13) vaccine  You may need this if you have certain conditions and were not previously vaccinated. Pneumococcal polysaccharide (PPSV23) vaccine  You may need one or two doses if you smoke cigarettes or if you have certain conditions. Meningococcal conjugate (MenACWY) vaccine  You may need this if you have certain conditions. Hepatitis A vaccine  You may need this if you have certain conditions or if you travel or work in places where you may be exposed to hepatitis A. Hepatitis B vaccine  You may need this if you have certain conditions or if you travel or work in places where you may be exposed to hepatitis B. Haemophilus influenzae type b (Hib) vaccine  You may need this if you have certain conditions. Human papillomavirus (HPV) vaccine  If recommended by your health care provider, you may need  three doses over 6 months. You may receive vaccines as individual doses or as more than one vaccine together in one shot (combination vaccines). Talk with your health care provider about the risks and benefits of combination vaccines. What tests do I need? Blood tests  Lipid and cholesterol levels. These may be checked every 5 years, or more  frequently if you are over 33 years old.  Hepatitis C test.  Hepatitis B test. Screening  Lung cancer screening. You may have this screening every year starting at age 35 if you have a 30-pack-year history of smoking and currently smoke or have quit within the past 15 years.  Colorectal cancer screening. All adults should have this screening starting at age 101 and continuing until age 33. Your health care provider may recommend screening at age 30 if you are at increased risk. You will have tests every 1-10 years, depending on your results and the type of screening test.  Diabetes screening. This is done by checking your blood sugar (glucose) after you have not eaten for a while (fasting). You may have this done every 1-3 years.  Mammogram. This may be done every 1-2 years. Talk with your health care provider about when you should start having regular mammograms. This may depend on whether you have a family history of breast cancer.  BRCA-related cancer screening. This may be done if you have a family history of breast, ovarian, tubal, or peritoneal cancers.  Pelvic exam and Pap test. This may be done every 3 years starting at age 29. Starting at age 70, this may be done every 5 years if you have a Pap test in combination with an HPV test. Other tests  Sexually transmitted disease (STD) testing.  Bone density scan. This is done to screen for osteoporosis. You may have this scan if you are at high risk for osteoporosis. Follow these instructions at home: Eating and drinking  Eat a diet that includes fresh fruits and vegetables, whole grains, lean protein, and low-fat dairy.  Take vitamin and mineral supplements as recommended by your health care provider.  Do not drink alcohol if: ? Your health care provider tells you not to drink. ? You are pregnant, may be pregnant, or are planning to become pregnant.  If you drink alcohol: ? Limit how much you have to 0-1 drink a day. ? Be aware  of how much alcohol is in your drink. In the [place] one drink equals one 12 oz bottle of beer (355 mL), one 5 oz glass of wine (148 mL), or one 1 oz glass of hard liquor (44 mL). Lifestyle  Take daily care of your teeth and gums.  Stay active. Exercise for at least 30 minutes on 5 or more days each week.  Do not use any products that contain nicotine or tobacco, such as cigarettes, e-cigarettes, and chewing tobacco. If you need help quitting, ask your health care provider.  If you are sexually active, practice safe sex. Use a condom or other form of birth control (contraception) in order to prevent pregnancy and STIs (sexually transmitted infections).  If told by your health care provider, take low-dose aspirin daily starting at age 98. What's next?  Visit your health care provider once a year for a well check visit.  Ask your health care provider how often you should have your eyes and teeth checked.  Stay up to date on all vaccines. This information is not intended to replace advice given to you by your  health care provider. Make sure you discuss any questions you have with your health care provider. Document Revised: 12/20/2017 Document Reviewed: 12/20/2017 Elsevier Patient Education  2020 [employer].

## 2020-03-24 NOTE — Progress Notes (Signed)
Patient: Bailey Perez, Female    DOB: 08/10/1970, 49 y.o.   MRN: [id number] Hubbard Hartshorn, FNP Visit Date: 03/24/2020  Today's Provider: Delsa Grana, PA-C   Chief Complaint  Patient presents with  . Annual Exam   Subjective:  Pt new to me, last OV was a year ago for her CPE with prior PCP Raelyn Ensign - med hx, labs, dx all reviewed today  Annual physical exam:  Bailey Perez is a 49 y.o. female who presents today for complete physical exam:  Exercise/Activity:  Very physical job most days of the week Diet/nutrition:overall healthy diet Sleep: sleeps well  Cough - for several weeks, dry and intermittent, denies any chest pain, wheeze, shortness of breath, fever, chills, sweats, fatigue She denies nasal symptoms or history of allergies, denies any past history of asthma or COPD  Pt wished to discuss acute complaints in addition to CPE.   Advised pt of separate visit billing/coding  USPSTF grade A and B recommendations - reviewed and addressed today  Depression:  Phq 9 completed today by patient, was reviewed by me with patient in the room PHQ score is negative, pt feels good PHQ 2/9 Scores 03/24/2020 03/11/2019 03/01/2018 03/01/2018  PHQ - 2 Score 0 0 0 0  PHQ- 9 Score - 0 0 -   Depression screen [facility] 2/9 03/24/2020 03/11/2019 03/01/2018 03/01/2018 02/21/2017  Decreased Interest 0 0 0 0 1  Down, Depressed, Hopeless 0 0 0 0 0  PHQ - 2 Score 0 0 0 0 1  Altered sleeping - 0 0 - 2  Tired, decreased energy - 0 0 - 2  Change in appetite - 0 0 - 0  Feeling bad or failure about yourself  - 0 0 - 0  Trouble concentrating - 0 0 - 0  Moving slowly or fidgety/restless - 0 0 - 0  Suicidal thoughts - 0 0 - 0  PHQ-9 Score - 0 0 - 5  Difficult doing work/chores - Not difficult at all Not difficult at all - Somewhat difficult    Alcohol screening:   Office Visit from 03/24/2020 in [facility]  AUDIT-C Score 0      Immunizations and Health  Maintenance: Health Maintenance  Topic Date Due  . MAMMOGRAM  02/02/2018  . PAP SMEAR-Modifier  02/22/2020  . TETANUS/TDAP  11/09/2020  . INFLUENZA VACCINE  Completed  . COVID-19 Vaccine  Completed  . Hepatitis C Screening  Completed  . HIV Screening  Completed     Hep C Screening: Done  STD testing and prevention (HIV/chl/gon/syphilis):  see above, no additional testing desired by pt today  Intimate partner violence: Denies, feels safe at home  Sexual History/Pain during Intercourse: Married  Menstrual History/LMP/Abnormal Bleeding: Light menses that are monthly Patient's last menstrual period was 03/03/2020.  Incontinence Symptoms: Some urge incontinence for many years no change or progression  Breast cancer: Due for mammogram Last Mammogram: *see HM list above   Cervical cancer screening: Pap and HPV were negative due for code testing or HPV testing alone in the next 2 years Pt no family hx of cancers -  ovarian, uterine, colon:     Osteoporosis:   Discussion on osteoporosis per age, including high calcium and vitamin D supplementation, weight bearing exercises  Skin cancer:  Hx of skin CA -  NO Discussed atypical lesions   Colorectal cancer:   Colonoscopy is due Discussed concerning signs and sx of CRC, pt denies melena, hematochezia,  change in bowel movements  Lung cancer:   Low Dose CT Chest recommended if Age 59-80 years, 30 pack-year currently smoking OR have quit w/in 15years. Patient does not qualify.    Social History   Tobacco Use  . Smoking status: Never Smoker  . Smokeless tobacco: Never Used  Vaping Use  . Vaping Use: Never used  Substance Use Topics  . Alcohol use: No    Alcohol/week: 0.0 standard drinks  . Drug use: No       Office Visit from 03/24/2020 in [facility]  AUDIT-C Score 0      Family History  Problem Relation Age of Onset  . Cancer Paternal Aunt        breast  . Breast cancer Paternal Aunt   . Heart  disease Neg Hx      Blood pressure/Hypertension: BP Readings from Last 3 Encounters:  03/24/20 110/72  03/11/19 110/64  03/01/18 98/62    Weight/Obesity: Wt Readings from Last 3 Encounters:  03/24/20 112 lb 11.2 oz (51.1 kg)  03/11/19 101 lb 14.4 oz (46.2 kg)  03/01/18 105 lb 4.8 oz (47.8 kg)   BMI Readings from Last 3 Encounters:  03/24/20 21.29 kg/m  03/11/19 19.25 kg/m  03/01/18 19.90 kg/m     Lipids:  Lab Results  Component Value Date   CHOL 167 03/11/2019   CHOL 157 03/01/2018   CHOL 135 02/21/2017   Lab Results  Component Value Date   HDL 64 03/11/2019   HDL 62 03/01/2018   HDL 60 02/21/2017   Lab Results  Component Value Date   LDLCALC 88 03/11/2019   LDLCALC 82 03/01/2018   LDLCALC 61 02/21/2017   Lab Results  Component Value Date   TRIG 64 03/11/2019   TRIG 44 03/01/2018   TRIG 68 02/21/2017   Lab Results  Component Value Date   CHOLHDL 2.6 03/11/2019   CHOLHDL 2.5 03/01/2018   CHOLHDL 2.3 02/21/2017   No results found for: LDLDIRECT Based on the results of lipid panel his/her cardiovascular risk factor ( using [facility] )  in the next 10 years is: The 10-year ASCVD risk score Mikey Bussing DC Brooke Bonito., et al., 2013) is: 0.5%   Values used to calculate the score:     Age: 49 years     Sex: Female     Is Non-Hispanic African American: No     Diabetic: No     Tobacco smoker: No     Systolic Blood Pressure: 831 mmHg     Is BP treated: No     HDL Cholesterol: 64 mg/dL     Total Cholesterol: 167 mg/dL Glucose:  Glucose, Bld  Date Value Ref Range Status  03/11/2019 86 65 - 99 mg/dL Final    Comment:    .            Fasting reference interval .   03/01/2018 79 65 - 99 mg/dL Final    Comment:    .            Fasting reference interval .   02/21/2017 83 65 - 99 mg/dL Final    Comment:    .            Fasting reference interval .    Hypertension: BP Readings from Last 3 Encounters:  03/24/20 110/72  03/11/19 110/64  03/01/18 98/62    Obesity: Wt Readings from Last 3 Encounters:  03/24/20 112 lb 11.2 oz (51.1 kg)  03/11/19 101  lb 14.4 oz (46.2 kg)  03/01/18 105 lb 4.8 oz (47.8 kg)   BMI Readings from Last 3 Encounters:  03/24/20 21.29 kg/m  03/11/19 19.25 kg/m  03/01/18 19.90 kg/m      Advanced Care Planning:  A voluntary discussion about advance care planning including the explanation and discussion of advance directives.   Discussed, doesn't have done with her husband  Social History      She        Social History   Socioeconomic History  . Marital status: Married    Spouse name: amph   . Number of children: 3  . Years of education: 48  . Highest education level: Some college, no degree  Occupational History  . Not on file  Tobacco Use  . Smoking status: Never Smoker  . Smokeless tobacco: Never Used  Vaping Use  . Vaping Use: Never used  Substance and Sexual Activity  . Alcohol use: No    Alcohol/week: 0.0 standard drinks  . Drug use: No  . Sexual activity: Yes    Partners: Male    Birth control/protection: Rhythm  Other Topics Concern  . Not on file  Social History Narrative  . Not on file   Social Determinants of Health   Financial Resource Strain: Low Risk   . Difficulty of Paying Living Expenses: Not hard at all  Food Insecurity: No Food Insecurity  . Worried About Charity fundraiser in the Last Year: Never true  . Ran Out of Food in the Last Year: Never true  Transportation Needs: No Transportation Needs  . Lack of Transportation (Medical): No  . Lack of Transportation (Non-Medical): No  Physical Activity: Insufficiently Active  . Days of Exercise per Week: 3 days  . Minutes of Exercise per Session: 10 min  Stress: No Stress Concern Present  . Feeling of Stress : Only a little  Social Connections: Socially Integrated  . Frequency of Communication with Friends and Family: Twice a week  . Frequency of Social Gatherings with Friends and Family: Twice a week  . Attends  Religious Services: 1 to 4 times per year  . Active Member of Clubs or Organizations: Yes  . Attends Archivist Meetings: More than 4 times per year  . Marital Status: Married    Family History        Family History  Problem Relation Age of Onset  . Cancer Paternal Aunt        breast  . Breast cancer Paternal Aunt   . Heart disease Neg Hx     Patient Active Problem List   Diagnosis Date Noted  . Axillary lymphadenopathy 03/09/2017  . History of anemia 02/21/2017  . ANA positive 09/09/2016  . Abnormal weight loss 09/09/2016  . 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 Joint pain 09/07/2016  . Dysphagia 09/07/2016  . Screening mammogram, encounter for 01/26/2016  . Microcytosis 01/18/2016  . Arthritis 12/24/2014  . History of migraine headaches 12/24/2014  . Annual physical exam 12/24/2014  . Other fatigue 12/24/2014    Past Surgical History:  Procedure Laterality Date  . NASAL ENDOSCOPY     Dr. Clyde Canterbury     Current Outpatient Medications:  .  BLACK CURRANT SEED OIL PO, Take by mouth., Disp: , Rfl:  .  calcium carbonate 1250 MG capsule, Take 1,250 mg by mouth 2 (two) times daily with a meal., Disp: , Rfl:  .  Echinacea 400 MG CAPS, Take 400 mg by mouth  daily., Disp: , Rfl:  .  Omega-3 Fatty Acids (FISH OIL) 1000 MG CAPS, Take by mouth., Disp: , Rfl:  .  SUPER B COMPLEX/C PO, Take 1 capsule by mouth daily., Disp: , Rfl:  .  TURMERIC PO, Take by mouth., Disp: , Rfl:   No Known Allergies  Patient Care Team: Hubbard Hartshorn, FNP as PCP - General (Family Medicine) Byrnett, Forest Gleason, MD (General Surgery) Marlowe Sax, MD as Referring Physician (Internal Medicine) Ralene Bathe, MD (Dermatology) Arnetha Courser, MD as Attending Physician (Family Medicine)  Review of Systems  10 Systems reviewed and are negative for acute change except as noted in the HPI.  I personally reviewed active problem list, medication list, allergies, family  history, social history, health maintenance, notes from last encounter, lab results, imaging with the patient/caregiver today.        Objective:   Vitals:  Vitals:   03/24/20 1012  BP: 110/72  Pulse: 84  Resp: 16  Temp: 98.1 F (36.7 C)  TempSrc: Oral  SpO2: 98%  Weight: 112 lb 11.2 oz (51.1 kg)  Height: 5\' 1"  (1.549 m)    Body mass index is 21.29 kg/m.  Physical Exam Vitals and nursing note reviewed.  Constitutional:      General: She is not in acute distress.    Appearance: Normal appearance. She is well-developed and normal weight. She is not ill-appearing, toxic-appearing or diaphoretic.     Interventions: Face mask in place.  HENT:     Head: Normocephalic and atraumatic.     Right Ear: Hearing, tympanic membrane, ear canal and external ear normal. There is no impacted cerumen.     Left Ear: Hearing, tympanic membrane, ear canal and external ear normal. There is no impacted cerumen.     Nose: Mucosal edema, congestion and rhinorrhea present.     Right Sinus: No maxillary sinus tenderness or frontal sinus tenderness.     Left Sinus: No maxillary sinus tenderness or frontal sinus tenderness.     Mouth/Throat:     Mouth: Mucous membranes are moist. Mucous membranes are not pale.     Pharynx: Oropharynx is clear. Uvula midline. No oropharyngeal exudate, posterior oropharyngeal erythema or uvula swelling.     Tonsils: No tonsillar abscesses.  Eyes:     General: Lids are normal. No scleral icterus.       Right eye: No discharge.        Left eye: No discharge.     Conjunctiva/sclera: Conjunctivae normal.     Pupils: Pupils are equal, round, and reactive to light.  Neck:     Trachea: Phonation normal. No tracheal deviation.  Cardiovascular:     Rate and Rhythm: Normal rate and regular rhythm.     Pulses: Normal pulses.          Radial pulses are 2+ on the right side and 2+ on the left side.       Posterior tibial pulses are 2+ on the right side and 2+ on the left side.      Heart sounds: Normal heart sounds. No murmur heard.  No friction rub. No gallop.   Pulmonary:     Effort: Pulmonary effort is normal. No respiratory distress.     Breath sounds: Normal breath sounds. No stridor. No wheezing, rhonchi or rales.  Chest:     Chest wall: No tenderness.  Abdominal:     General: Abdomen is flat. Bowel sounds are normal. There is no distension.  Palpations: Abdomen is soft.     Tenderness: There is no abdominal tenderness. There is no right CVA tenderness or left CVA tenderness.  Musculoskeletal:     Cervical back: Normal range of motion and neck supple.     Right lower leg: No edema.     Left lower leg: No edema.  Skin:    General: Skin is warm and dry.     Coloration: Skin is not jaundiced or pale.     Findings: No rash.  Neurological:     Mental Status: She is alert.     Motor: No abnormal muscle tone.     Coordination: Coordination normal.     Gait: Gait normal.  Psychiatric:        Mood and Affect: Mood normal.        Speech: Speech normal.        Behavior: Behavior normal.       Fall Risk: Fall Risk  03/24/2020 03/11/2019 03/01/2018 09/07/2016 01/17/2016  Falls in the past year? 0 0 0 No No  Number falls in past yr: 0 0 - - -  Injury with Fall? 0 0 - - -  Follow up Falls evaluation completed Falls evaluation completed - - -    Functional Status Survey: Is the patient deaf or have difficulty hearing?: No Does the patient have difficulty seeing, even when wearing glasses/contacts?: No Does the patient have difficulty concentrating, remembering, or making decisions?: No Does the patient have difficulty walking or climbing stairs?: No Does the patient have difficulty dressing or bathing?: No Does the patient have difficulty doing errands alone such as visiting a doctor's office or shopping?: No   Assessment & Plan:    CPE completed today  . USPSTF grade A and B recommendations reviewed with patient; age-appropriate recommendations,  preventive care, screening tests, etc discussed and encouraged; healthy living encouraged; see AVS for patient education given to patient  . Discussed importance of 150 minutes of physical activity weekly, AHA exercise recommendations given to pt in AVS/handout  . Discussed importance of healthy diet:  eating lean meats and proteins, avoiding trans fats and saturated fats, avoid simple sugars and excessive carbs in diet, eat 6 servings of fruit/vegetables daily and drink plenty of water and avoid sweet beverages.    . Recommended pt to do annual eye exam and routine dental exams/cleanings  . Depression, alcohol, fall screening completed as documented above and per flowsheets  . Reviewed Health Maintenance: Health Maintenance  Topic Date Due  . MAMMOGRAM  02/02/2018  . PAP SMEAR-Modifier  02/22/2020  . TETANUS/TDAP  11/09/2020  . INFLUENZA VACCINE  Completed  . COVID-19 Vaccine  Completed  . Hepatitis C Screening  Completed  . HIV Screening  Completed    . Immunizations: Immunization History  Administered Date(s) Administered  . Influenza-Unspecified 01/11/2020  . PFIZER SARS-COV-2 Vaccination 05/12/2019, 06/02/2019  . Tdap 11/10/2010     ICD-10-CM   1. 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 Encounter for screening mammogram for malignant neoplasm of breast  Z12.31 MM 3D SCREEN BREAST BILATERAL   Due for mammogram, ordered, patient encouraged to call and schedule  4. Screening for malignant neoplasm of cervix  Z12.4    Due for HPV testing in the next 2 years last results reviewed  and ACS cervical cancer screening guidelines reviewed with patient  5. Allergic rhinitis, unspecified seasonality, unspecified trigger  J30.9    Enlarged nasal turbinates, erythematous and edematous nasal mucosa with profuse nasal discharge no sinus  tenderness to palpation  6. Cough  R05.9    Likely due to postnasal drip and or allergies, lungs clear, no symptoms concerning for pneumonia  7. Screening for malignant neoplasm of colon  Z12.11    Encourage patient to follow-up with her gastroenterologist but she declined for me to put a note additional referral for colonoscopy at this time   Did encourage patient to start a steroid nasal spray, use saline spray or cool mister, and try a daily antihistamine such as Claritin Zyrtec or Allegra Patient said she did not want take those medications but she did request an antibiotic I explained to her that she has no indication for an antibiotic today she does not have any symptoms or exam findings concerning for pneumonia, she has nasal symptoms but no sinus tenderness to palpation no fever and she does not need antibiotics for a bacterial sinus infection and she should manage this with other antihistamines decongestants and local steroids   Delsa Grana, PA-C 03/24/20 10:36 AM  [place]

## 2020-03-25 LAB — CBC WITH DIFFERENTIAL/PLATELET
Absolute Monocytes: 441 cells/uL (ref 200–950)
Basophils Absolute: 30 cells/uL (ref 0–200)
Eosinophils Absolute: 167 cells/uL (ref 15–500)
Eosinophils Relative: 2.2 %
HCT: 39.3 % (ref 35.0–45.0)
Lymphs Abs: 1908 cells/uL (ref 850–3900)
MCH: 23.9 pg — ABNORMAL LOW (ref 27.0–33.0)
MCV: 75.1 fL — ABNORMAL LOW (ref 80.0–100.0)
MPV: 10.4 fL (ref 7.5–12.5)
Monocytes Relative: 5.8 %
Neutro Abs: 5054 cells/uL (ref 1500–7800)
Neutrophils Relative %: 66.5 %
Platelets: 505 10*3/uL — ABNORMAL HIGH (ref 140–400)
RBC: 5.23 10*6/uL — ABNORMAL HIGH (ref 3.80–5.10)
RDW: 13.9 % (ref 11.0–15.0)
Total Lymphocyte: 25.1 %

## 2020-03-25 LAB — LIPID PANEL
Cholesterol: 173 mg/dL (ref <200)
HDL: 58 mg/dL (ref >=50)
LDL Cholesterol (Calc): 96 mg/dL (calc)
Non-HDL Cholesterol (Calc): 115 mg/dL (calc) (ref <130)
Total CHOL/HDL Ratio: 3 (calc) (ref <5.0)
Triglycerides: 93 mg/dL (ref <150)

## 2020-03-25 LAB — COMPLETE METABOLIC PANEL WITH GFR
AG Ratio: 1.1 (calc) (ref 1.0–2.5)
ALT: 16 U/L (ref 6–29)
AST: 17 U/L (ref 10–35)
Albumin: 4 g/dL (ref 3.6–5.1)
Alkaline phosphatase (APISO): 73 U/L (ref 31–125)
BUN: 11 mg/dL (ref 7–25)
CO2: 29 mmol/L (ref 20–32)
Calcium: 9.4 mg/dL (ref 8.6–10.2)
Chloride: 102 mmol/L (ref 98–110)
Creat: 0.58 mg/dL (ref 0.50–1.10)
GFR, Est African American: 126 mL/min/{1.73_m2} (ref >=60)
GFR, Est Non African American: 109 mL/min/{1.73_m2} (ref >=60)
Globulin: 3.8 g/dL (calc) — ABNORMAL HIGH (ref 1.9–3.7)
Glucose, Bld: 82 mg/dL (ref 65–99)
Potassium: 4.6 mmol/L (ref 3.5–5.3)
Sodium: 139 mmol/L (ref 135–146)
Total Bilirubin: 0.4 mg/dL (ref 0.2–1.2)
Total Protein: 7.8 g/dL (ref 6.1–8.1)

## 2020-03-25 LAB — HEMOGLOBIN A1C
Hgb A1c MFr Bld: 5.6 % of total Hgb (ref <5.7)
Mean Plasma Glucose: 114 (calc)
eAG (mmol/L): 6.3 (calc)

## 2020-03-25 LAB — TSH: TSH: 1.72 mIU/L

## 2020-06-11 ENCOUNTER — Other Ambulatory Visit (HOSPITAL_COMMUNITY): Payer: Self-pay | Admitting: Internal Medicine

## 2020-06-11 ENCOUNTER — Ambulatory Visit: Payer: 59 | Attending: Internal Medicine

## 2020-06-11 DIAGNOSIS — Z23 Encounter for immunization: Secondary | ICD-10-CM

## 2020-06-11 NOTE — Progress Notes (Signed)
   Covid-19 Vaccination Clinic  Name:  Bailey Perez    MRN: [id number] DOB: [birth year]  06/11/2020  Bailey Perez was observed post Covid-19 immunization for 15 minutes without incident. She was provided with Vaccine Information Sheet and instruction to access the V-Safe system.   Bailey Perez was instructed to call 911 with any severe reactions post vaccine: Marland Kitchen Difficulty breathing  . Swelling of face and throat  . A fast heartbeat  . A bad rash all over body  . Dizziness and weakness   Immunizations Administered    Name Date Dose VIS Date Route   PFIZER Comrnaty(Gray TOP) Covid-19 Vaccine 06/11/2020  9:02 AM 0.3 mL 04/01/2020 Intramuscular   Manufacturer: [place]   Lot: [id number]   NDC: [phone]

## 2021-04-20 ENCOUNTER — Ambulatory Visit (INDEPENDENT_AMBULATORY_CARE_PROVIDER_SITE_OTHER): Payer: 59 | Admitting: Nurse Practitioner

## 2021-04-20 ENCOUNTER — Encounter: Payer: Self-pay | Admitting: Nurse Practitioner

## 2021-04-20 ENCOUNTER — Other Ambulatory Visit: Payer: Self-pay

## 2021-04-20 ENCOUNTER — Other Ambulatory Visit (HOSPITAL_COMMUNITY)
Admission: RE | Admit: 2021-04-20 | Discharge: 2021-04-20 | Disposition: A | Discharge status: Home / Self Care | Payer: 59 | Source: Ambulatory Visit | Attending: Family Medicine | Admitting: Family Medicine

## 2021-04-20 ENCOUNTER — Telehealth: Payer: Self-pay

## 2021-04-20 VITALS — BP 118/72 | HR 85 | Temp 98.2°F | Resp 16 | Ht 61.0 in | Wt 118.0 lb

## 2021-04-20 DIAGNOSIS — Z1211 Encounter for screening for malignant neoplasm of colon: Secondary | ICD-10-CM

## 2021-04-20 DIAGNOSIS — Z1231 Encounter for screening mammogram for malignant neoplasm of breast: Secondary | ICD-10-CM | POA: Diagnosis not present

## 2021-04-20 DIAGNOSIS — D582 Other hemoglobinopathies: Secondary | ICD-10-CM

## 2021-04-20 DIAGNOSIS — Z13220 Encounter for screening for lipoid disorders: Secondary | ICD-10-CM | POA: Diagnosis not present

## 2021-04-20 DIAGNOSIS — Z0000 Encounter for general adult medical examination without abnormal findings: Secondary | ICD-10-CM | POA: Diagnosis not present

## 2021-04-20 DIAGNOSIS — Z124 Encounter for screening for malignant neoplasm of cervix: Secondary | ICD-10-CM | POA: Diagnosis not present

## 2021-04-20 DIAGNOSIS — Z862 Personal history of diseases of the blood and blood-forming organs and certain disorders involving the immune mechanism: Secondary | ICD-10-CM

## 2021-04-20 DIAGNOSIS — Z131 Encounter for screening for diabetes mellitus: Secondary | ICD-10-CM | POA: Diagnosis not present

## 2021-04-20 NOTE — Telephone Encounter (Signed)
CALLED PATIENT NO ANSWER LEFT VOICEMAIL FOR A CALL BACK ? ?

## 2021-04-20 NOTE — Progress Notes (Signed)
Name: LLUVIA GWYNNE   MRN: [id number]    DOB: [birth year]   Date:04/20/2021       Progress Note  Subjective  Chief Complaint  Chief Complaint  Patient presents with   Annual Exam    HPI  Patient presents for annual CPE.  Diet: Well balanced diet, eats everything Exercise: just work, nurses aid, physical job.  Discussed the recommendation of 150 min a week of physical activity.   [place] Visit from 04/20/2021 in [facility]  AUDIT-C Score 0      Depression: Phq 9 is  negative Depression screen [facility] 2/9 04/20/2021 03/24/2020 03/11/2019 03/01/2018 03/01/2018  Decreased Interest 0 0 0 0 0  Down, Depressed, Hopeless 0 0 0 0 0  PHQ - 2 Score 0 0 0 0 0  Altered sleeping 0 - 0 0 -  Tired, decreased energy 0 - 0 0 -  Change in appetite 0 - 0 0 -  Feeling bad or failure about yourself  0 - 0 0 -  Trouble concentrating 0 - 0 0 -  Moving slowly or fidgety/restless 0 - 0 0 -  Suicidal thoughts 0 - 0 0 -  PHQ-9 Score 0 - 0 0 -  Difficult doing work/chores Not difficult at all - Not difficult at all Not difficult at all -   Hypertension: BP Readings from Last 3 Encounters:  04/20/21 118/72  03/24/20 110/72  03/11/19 110/64   Obesity: Wt Readings from Last 3 Encounters:  04/20/21 118 lb (53.5 kg)  03/24/20 112 lb 11.2 oz (51.1 kg)  03/11/19 101 lb 14.4 oz (46.2 kg)   BMI Readings from Last 3 Encounters:  04/20/21 22.30 kg/m  03/24/20 21.29 kg/m  03/11/19 19.25 kg/m     Vaccines:  HPV: up to at age 49 , ask insurance if age between 55-45  Shingrix: 49-64 yo and ask insurance if covered when patient above 49 yo Pneumonia: educated and discussed with patient. Flu: educated and discussed with patient.  Hep C Screening: 02/21/2017 STD testing and prevention (HIV/chl/gon/syphilis): 03/01/2018 Intimate partner violence:none Sexual History : married, one partner Menstrual History/LMP/Abnormal Bleeding: [facility]: two weeks ago, light, and  irregular Incontinence Symptoms: none  Breast cancer:  - Last Mammogram: 02/02/2017, 50  - BRCA gene screening: none  Osteoporosis: Discussed high calcium and vitamin D supplementation, weight bearing exercises  Cervical cancer screening: 02/21/2017, 50   Skin cancer: Discussed monitoring for atypical lesions  Colorectal cancer: none, discussed options  Lung cancer:  Low Dose CT Chest recommended if Age 49-80 years, 20 pack-year currently smoking OR have quit w/in 50years. Patient does not qualify.   ECG: 02/02/2016  Advanced Care Planning: A voluntary discussion about advance care planning including the explanation and discussion of advance directives.  Discussed health care proxy and Living will, and the patient was able to identify a health care proxy as husband.    Lipids: Lab Results  Component Value Date   CHOL 173 03/24/2020   CHOL 167 03/11/2019   CHOL 157 03/01/2018   Lab Results  Component Value Date   HDL 58 03/24/2020   HDL 64 03/11/2019   HDL 62 03/01/2018   Lab Results  Component Value Date   LDLCALC 96 03/24/2020   LDLCALC 88 03/11/2019   LDLCALC 82 03/01/2018   Lab Results  Component Value Date   TRIG 93 03/24/2020   TRIG 64 03/11/2019   TRIG 44 03/01/2018   Lab Results  Component Value Date  CHOLHDL 3.0 03/24/2020   CHOLHDL 2.6 03/11/2019   CHOLHDL 2.5 03/01/2018   No results found for: LDLDIRECT  Glucose: Glucose, Bld  Date Value Ref Range Status  03/24/2020 82 65 - 99 mg/dL Final    Comment:    .            Fasting reference interval .   03/11/2019 86 65 - 99 mg/dL Final    Comment:    .            Fasting reference interval .   03/01/2018 79 65 - 99 mg/dL Final    Comment:    .            Fasting reference interval .     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 Clyde Canterbury    Family History  Problem Relation Age of Onset   Cancer Paternal Aunt        breast   Breast cancer Paternal Aunt    Heart disease Neg Hx     Social History   Socioeconomic History   Marital status: Married    Spouse name: amph    Number of children: 3   Years of education: 14   Highest education level: Some college, no degree  Occupational History   Not on file  Tobacco Use   Smoking status: Never   Smokeless tobacco: Never  Vaping Use   Vaping Use: Never used  Substance and Sexual Activity   Alcohol use: No    Alcohol/week: 0.0 standard drinks   Drug use: No   Sexual activity: Yes    Partners: Male    Birth control/protection: Rhythm  Other Topics Concern   Not on file  Social History Narrative   Not on file   Social Determinants of Health   Financial Resource Strain: Low Risk    Difficulty of Paying Living Expenses: Not hard at all  Food Insecurity: Not on file  Transportation Needs: No Transportation Needs   Lack of Transportation (Medical): No   Lack of Transportation (Non-Medical): No  Physical Activity: Insufficiently Active   Days of Exercise per Week: 2 days   Minutes of Exercise per Session: 10 min  Stress: No Stress Concern Present   Feeling of Stress : Only a little  Social Connections: Engineer, building services of Communication with Friends and Family: Twice a week   Frequency of Social Gatherings with Friends and Family: More than three times a week   Attends Religious Services: More than 4 times per year   Active Member of [employer] or Organizations: Yes   Attends Programme researcher, broadcasting/film/video: More than 4 times per year   Marital Status: Married  Human resources officer Violence: Not At Risk   Fear of Current or Ex-Partner: No   Emotionally Abused: No   Physically Abused: No   Sexually Abused: No     Current Outpatient Medications:    calcium carbonate  1250 MG capsule, Take 1,250 mg by mouth 2 (two) times daily with a meal., Disp: , Rfl:    Echinacea 400 MG CAPS, Take 400 mg by mouth daily., Disp: , Rfl:    Omega-3 Fatty Acids (FISH OIL) 1000 MG CAPS, Take by mouth., Disp: , Rfl:    TURMERIC PO, Take by mouth., Disp: , Rfl:    BLACK CURRANT SEED OIL PO, Take by mouth., Disp: , Rfl:    COVID-19 mRNA Vac-TriS, Pfizer, SUSP injection, USE AS DIRECTED (Patient not taking: Reported on 04/20/2021), Disp: .3 mL, Rfl: 0   SUPER B COMPLEX/C PO, Take 1 capsule by mouth daily., Disp: , Rfl:   No Known Allergies   ROS  Constitutional: Negative for fever or weight change.  Respiratory: Negative for cough and shortness of breath.   Cardiovascular: Negative for chest pain or palpitations.  Gastrointestinal: Negative for abdominal pain, no bowel changes.  Musculoskeletal: Negative for gait problem or joint swelling.  Skin: Negative for rash.  Neurological: Negative for dizziness or headache.  No other specific complaints in a complete review of systems (except as listed in HPI above).   Objective  Vitals:   04/20/21 0936  BP: 118/72  Pulse: 85  Resp: 16  Temp: 98.2 F (36.8 C)  TempSrc: Oral  SpO2: 98%  Weight: 118 lb (53.5 kg)  Height: 5' 1"  (1.549 m)    Body mass index is 22.3 kg/m.  Physical Exam  Constitutional: Patient appears well-developed and well-nourished. No distress.  HENT: Head: Normocephalic and atraumatic. Ears: B TMs ok, no erythema or effusion; Nose: Nose normal. Mouth/Throat: not done Eyes: Conjunctivae and EOM are normal. Pupils are equal, round, and reactive to light. No scleral icterus.  Neck: Normal range of motion. Neck supple.  No JVD present. No thyromegaly present.  Cardiovascular: Normal rate, regular rhythm and normal heart sounds.  No murmur heard. No BLE edema. Pulmonary/Chest: Effort normal and breath sounds normal. No respiratory distress. Abdominal: Soft. Bowel sounds are normal, no distension. There is no tenderness. no masses Breast: no lumps or masses, no nipple discharge or rashes FEMALE GENITALIA:  External genitalia normal External urethra normal Vaginal vault normal without discharge or lesions Cervix normal without discharge or lesions Bimanual exam normal without masses RECTAL: not done Musculoskeletal: Normal range of motion, no joint effusions. No gross deformities Neurological: he is alert and oriented to person, place, and time. No cranial nerve deficit. Coordination, balance, strength, speech and gait are normal.  Skin: Skin is warm and dry. No rash noted. No erythema.  Psychiatric: Patient has a normal mood and affect. behavior is normal. Judgment and thought content normal.   No results found for this or any previous visit (from the past 2160 hour(s)).   Fall Risk: Fall Risk  04/20/2021 03/24/2020 03/11/2019 03/01/2018 09/07/2016  Falls in the past year? 0 0 0 0 No  Number falls in past yr: 0 0 0 - -  Injury with Fall? 0 0 0 - -  Follow up Falls evaluation completed Falls evaluation completed Falls evaluation completed - -     Functional Status Survey: Is the patient deaf or have difficulty hearing?: No Does the patient have difficulty seeing, even when wearing glasses/contacts?: No Does the patient have difficulty concentrating, remembering, or making decisions?: No Does the patient have difficulty walking or climbing stairs?: No Does the patient have difficulty dressing or bathing?: No Does the patient have difficulty doing errands alone such as visiting a doctor's office or shopping?:  No   Assessment & Plan  1. Annual physical exam  - Ambulatory referral to  Gastroenterology - MM DIAG BREAST TOMO BILATERAL; Future - Lipid panel - CBC with Differential/Platelet - COMPLETE METABOLIC PANEL WITH GFR - Hemoglobin A1c  2. History of anemia  - CBC with Differential/Platelet  3. 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HCC)  - CBC with Differential/Platelet  4. Screening for diabetes mellitus  - COMPLETE METABOLIC PANEL WITH GFR - Hemoglobin A1c  5. Screening for cholesterol level  - Lipid panel - COMPLETE METABOLIC PANEL WITH GFR  6. Encounter for screening mammogram for malignant neoplasm of breast  - MM DIAG BREAST TOMO BILATERAL; Future  7. Screening for malignant neoplasm of colon  - Ambulatory referral to Gastroenterology  8. Screening for cervical cancer  - Cytology - PAP   -USPSTF grade A and B recommendations reviewed with patient; age-appropriate recommendations, preventive care, screening tests, etc discussed and encouraged; healthy living encouraged; see AVS for patient education given to patient -Discussed importance of 150 minutes of physical activity weekly, eat two servings of fish weekly, eat one serving of tree nuts ( cashews, pistachios, pecans, almonds.Marland Kitchen) every other day, eat 6 servings of fruit/vegetables daily and drink plenty of water and avoid sweet beverages.

## 2021-04-21 LAB — CBC WITH DIFFERENTIAL/PLATELET
Absolute Monocytes: 372 cells/uL (ref 200–950)
Basophils Absolute: 30 cells/uL (ref 0–200)
Basophils Relative: 0.5 %
Eosinophils Absolute: 271 cells/uL (ref 15–500)
Eosinophils Relative: 4.6 %
HCT: 39.9 % (ref 35.0–45.0)
Hemoglobin: 12.7 g/dL (ref 11.7–15.5)
Lymphs Abs: 2584 cells/uL (ref 850–3900)
MCH: 24.6 pg — ABNORMAL LOW (ref 27.0–33.0)
MCHC: 31.8 g/dL — ABNORMAL LOW (ref 32.0–36.0)
MCV: 77.3 fL — ABNORMAL LOW (ref 80.0–100.0)
MPV: 11.1 fL (ref 7.5–12.5)
Monocytes Relative: 6.3 %
Neutro Abs: 2643 cells/uL (ref 1500–7800)
Neutrophils Relative %: 44.8 %
Platelets: 375 10*3/uL (ref 140–400)
RBC: 5.16 10*6/uL — ABNORMAL HIGH (ref 3.80–5.10)
RDW: 14.6 % (ref 11.0–15.0)
Total Lymphocyte: 43.8 %
WBC: 5.9 10*3/uL (ref 3.8–10.8)

## 2021-04-21 LAB — COMPLETE METABOLIC PANEL WITH GFR
AG Ratio: 1.2 (calc) (ref 1.0–2.5)
ALT: 10 U/L (ref 6–29)
AST: 14 U/L (ref 10–35)
Albumin: 4.1 g/dL (ref 3.6–5.1)
Alkaline phosphatase (APISO): 70 U/L (ref 31–125)
BUN: 13 mg/dL (ref 7–25)
CO2: 26 mmol/L (ref 20–32)
Calcium: 9.4 mg/dL (ref 8.6–10.2)
Chloride: 105 mmol/L (ref 98–110)
Creat: 0.7 mg/dL (ref 0.50–0.99)
Globulin: 3.3 g/dL (calc) (ref 1.9–3.7)
Glucose, Bld: 86 mg/dL (ref 65–99)
Potassium: 4.4 mmol/L (ref 3.5–5.3)
Sodium: 140 mmol/L (ref 135–146)
Total Bilirubin: 0.4 mg/dL (ref 0.2–1.2)
Total Protein: 7.4 g/dL (ref 6.1–8.1)
eGFR: 106 mL/min/{1.73_m2} (ref >=60)

## 2021-04-21 LAB — LIPID PANEL
Cholesterol: 202 mg/dL — ABNORMAL HIGH (ref <200)
HDL: 64 mg/dL (ref >=50)
LDL Cholesterol (Calc): 117 mg/dL (calc) — ABNORMAL HIGH
Non-HDL Cholesterol (Calc): 138 mg/dL (calc) — ABNORMAL HIGH (ref <130)
Total CHOL/HDL Ratio: 3.2 (calc) (ref <5.0)
Triglycerides: 100 mg/dL (ref <150)

## 2021-04-21 LAB — HEMOGLOBIN A1C
Hgb A1c MFr Bld: 5.5 % of total Hgb (ref <5.7)
Mean Plasma Glucose: 111 mg/dL
eAG (mmol/L): 6.2 mmol/L

## 2021-04-22 LAB — CYTOLOGY - PAP
Adequacy: ABSENT
Chlamydia: NEGATIVE
Comment: NEGATIVE
Comment: NORMAL
Diagnosis: NEGATIVE
Neisseria Gonorrhea: NEGATIVE

## 2022-04-25 NOTE — Progress Notes (Signed)
Name: Bailey Perez   MRN: [id number]    DOB: 11-10-1970   Date:04/26/2022       Progress Note  Subjective  Chief Complaint  Chief Complaint  Patient presents with   Annual Exam    HPI  Patient presents for annual CPE.  Diet: she tries to eat well balanced Exercise: walks a lot, and has a physical job  Sleep: 4 hours Last dental exam:last year Last eye exam: she is going to schedule one this year  Flowsheet Row Office Visit from 04/26/2022 in [facility]  AUDIT-C Score 0      Depression: Phq 9 is  negative    04/26/2022    9:37 AM 04/20/2021    9:35 AM 03/24/2020   10:14 AM 03/11/2019    9:49 AM 03/01/2018   11:42 AM  Depression screen PHQ 2/9  Decreased Interest 0 0 0 0 0  Down, Depressed, Hopeless 0 0 0 0 0  PHQ - 2 Score 0 0 0 0 0  Altered sleeping  0  0 0  Tired, decreased energy  0  0 0  Change in appetite  0  0 0  Feeling bad or failure about yourself   0  0 0  Trouble concentrating  0  0 0  Moving slowly or fidgety/restless  0  0 0  Suicidal thoughts  0  0 0  PHQ-9 Score  0  0 0  Difficult doing work/chores  Not difficult at all  Not difficult at all Not difficult at all   Hypertension: BP Readings from Last 3 Encounters:  04/26/22 118/68  04/20/21 118/72  03/24/20 110/72   Obesity: Wt Readings from Last 3 Encounters:  04/26/22 128 lb 1.6 oz (58.1 kg)  04/20/21 118 lb (53.5 kg)  03/24/20 112 lb 11.2 oz (51.1 kg)   BMI Readings from Last 3 Encounters:  04/26/22 24.20 kg/m  04/20/21 22.30 kg/m  03/24/20 21.29 kg/m     Vaccines:  HPV: up to at age 46 , ask insurance if age between 53-45  Shingrix: 44-64 yo and ask insurance if covered when patient above 86 yo Pneumonia:  educated and discussed with patient. Flu:  educated and discussed with patient.  Hep C Screening: 02/21/2017 STD testing and prevention (HIV/chl/gon/syphilis): 03/01/2018 Intimate partner violence:none Sexual History : yes Menstrual  History/LMP/Abnormal Bleeding: Last year Incontinence Symptoms: a little stress incontinence  Breast cancer:  - Last Mammogram: 02/02/2017, due - BRCA gene screening: none  Osteoporosis: Discussed high calcium and vitamin D supplementation, weight bearing exercises  Cervical cancer screening: 04/20/2021  Skin cancer: Discussed monitoring for atypical lesions  Colorectal cancer: due   Lung cancer:   Low Dose CT Chest recommended if Age 1-80 years, 20 pack-year currently smoking OR have quit w/in 15years. Patient does not qualify.   ECG: 02/02/2016  Advanced Care Planning: A voluntary discussion about advance care planning including the explanation and discussion of advance directives.  Discussed health care proxy and Living will, and the patient was able to identify a health care proxy as husband.  Patient does not have a living will at present time. If patient does have living will, I have requested they bring this to the clinic to be scanned in to their chart.  Lipids: Lab Results  Component Value Date   CHOL 202 (H) 04/20/2021   CHOL 173 03/24/2020   CHOL 167 03/11/2019   Lab Results  Component Value Date   HDL 64 04/20/2021  HDL 58 03/24/2020   HDL 64 03/11/2019   Lab Results  Component Value Date   LDLCALC 117 (H) 04/20/2021   LDLCALC 96 03/24/2020   LDLCALC 88 03/11/2019   Lab Results  Component Value Date   TRIG 100 04/20/2021   TRIG 93 03/24/2020   TRIG 64 03/11/2019   Lab Results  Component Value Date   CHOLHDL 3.2 04/20/2021   CHOLHDL 3.0 03/24/2020   CHOLHDL 2.6 03/11/2019   No results found for: "LDLDIRECT"  Glucose: Glucose, Bld  Date Value Ref Range Status  04/20/2021 86 65 - 99 mg/dL Final    Comment:    .            Fasting reference interval .   03/24/2020 82 65 - 99 mg/dL Final    Comment:    .            Fasting reference interval .   03/11/2019 86 65 - 99 mg/dL Final    Comment:    .            Fasting reference interval .      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 Geanie Logan    Family History  Problem Relation Age of Onset   Cancer Paternal Aunt        breast   Breast cancer Paternal Aunt    Heart disease Neg Hx     Social History   Socioeconomic History   Marital status: Married    Spouse name: amph    Number of children: 3   Years of education: 14   Highest education level: Some college, no degree  Occupational History   Not on file  Tobacco Use   Smoking status: Never   Smokeless tobacco: Never  Vaping Use   Vaping Use: Never used  Substance and Sexual Activity   Alcohol use: No    Alcohol/week: 0.0 standard drinks of alcohol   Drug use: No   Sexual activity: Yes    Partners: Male    Birth control/protection: Rhythm  Other Topics Concern   Not on file  Social History Narrative   Works at [employer] for 13 years   Social Determinants of Corporate investment banker Strain: Low Risk  (04/26/2022)   Overall Financial Resource Strain (CARDIA)    Difficulty of Paying Living Expenses: Not hard at all  Food Insecurity: No Food Insecurity (04/26/2022)   Hunger Vital Sign    Worried About Radiation protection practitioner of Food in the Last Year: Never true    Ran Out of Food in the Last Year: Never true  Transportation Needs: No Transportation Needs (04/26/2022)   PRAPARE - Administrator, Civil Service (Medical): No    Lack of Transportation (Non-Medical): No  Physical Activity: Unknown  (04/26/2022)   Exercise Vital Sign    Days of Exercise per Week: 2 days    Minutes of Exercise per Session: Not on  file  Stress: Stress Concern Present (04/26/2022)   [employer] of Occupational Health - Occupational Stress Questionnaire    Feeling of Stress : To some extent  Social Connections: Socially Integrated (04/26/2022)   Social Connection and Isolation Panel [NHANES]    Frequency of Communication with Friends and Family: More than three times a week    Frequency of Social Gatherings with Friends and Family: More than three times a week    Attends Religious Services: 1 to 4 times per year    Active Member of [employer] or Organizations: No    Attends Engineer, structural: 1 to 4 times per year    Marital Status: Married  Catering manager Violence: Not At Risk (04/26/2022)   Humiliation, Afraid, Rape, and Kick questionnaire    Fear of Current or Ex-Partner: No    Emotionally Abused: No    Physically Abused: No    Sexually Abused: No     Current Outpatient Medications:    calcium carbonate 1250 MG capsule, Take 1,250 mg by mouth 2 (two) times daily with a meal., Disp: , Rfl:    Echinacea 400 MG CAPS, Take 400 mg by mouth daily., Disp: , Rfl:    Omega-3 Fatty Acids (FISH OIL) 1000 MG CAPS, Take by mouth., Disp: , Rfl:    TURMERIC PO, Take by mouth., Disp: , Rfl:   No Known Allergies   ROS  Constitutional: Negative for fever or weight change.  Respiratory: Negative for cough and shortness of breath.   Cardiovascular: Negative for chest pain or palpitations.  Gastrointestinal: Negative for abdominal pain, no bowel changes.  Musculoskeletal: Negative for gait problem or joint swelling.  Skin: Negative for rash.  Neurological: Negative for dizziness or headache.  No other specific complaints in a complete review of systems (except as listed in HPI above).   Objective  Vitals:   04/26/22 0929  BP: 118/68  Pulse: 90  Resp: 16  Temp: 98.3 F (36.8 C)  TempSrc:  Oral  SpO2: 98%  Weight: 128 lb 1.6 oz (58.1 kg)  Height: 5\' 1"  (1.549 m)    Body mass index is 24.2 kg/m.  Physical Exam Constitutional: Patient appears well-developed and well-nourished. No distress.  HENT: Head: Normocephalic and atraumatic. Ears: B TMs ok, no erythema or effusion; Nose: Nose normal. Mouth/Throat: Oropharynx is clear and moist. No oropharyngeal exudate.  Eyes: Conjunctivae and EOM are normal. Pupils are equal, round, and reactive to light. No scleral icterus.  Neck: Normal range of motion. Neck supple. No JVD present. No thyromegaly present.  Cardiovascular: Normal rate, regular rhythm and normal heart sounds.  No murmur heard. No BLE edema. Pulmonary/Chest: Effort normal and breath sounds normal. No respiratory distress. Abdominal: Soft. Bowel sounds are normal, no distension. There is no tenderness. no masses Breast: no lumps or masses, no nipple discharge or rashes Musculoskeletal: Normal range of motion, no joint effusions. No gross deformities Neurological: he is alert and oriented to person, place, and time. No cranial nerve deficit. Coordination, balance, strength, speech and gait are normal.  Skin: Skin is warm and dry. No rash noted. No erythema.  Psychiatric: Patient has a normal mood and affect. behavior is normal. Judgment and thought content normal.   No results found for this or any previous visit (from the past 2160 hour(s)).    Fall Risk:    04/26/2022    9:36 AM 04/20/2021    9:35 AM 03/24/2020   10:14 AM 03/11/2019    9:49 AM 03/01/2018  11:43 AM  Fall Risk   Falls in the past year? 0 0 0 0 0  Number falls in past yr: 0 0 0 0   Injury with Fall? 0 0 0 0   Follow up Falls evaluation completed Falls evaluation completed Falls evaluation completed Falls evaluation completed     Functional Status Survey: Is the patient deaf or have difficulty hearing?: No Does the patient have difficulty seeing, even when wearing glasses/contacts?: No Does  the patient have difficulty concentrating, remembering, or making decisions?: No Does the patient have difficulty walking or climbing stairs?: No Does the patient have difficulty dressing or bathing?: No Does the patient have difficulty doing errands alone such as visiting a doctor's office or shopping?: No   Assessment & Plan  1. Annual physical exam  - CBC with Differential/Platelet - COMPLETE METABOLIC PANEL WITH GFR - Lipid panel - Hemoglobin A1c - Ambulatory referral to Gastroenterology - MM 3D SCREEN BREAST BILATERAL; Future  2. History of anemia  - CBC with Differential/Platelet  3. Screening for diabetes mellitus  - COMPLETE METABOLIC PANEL WITH GFR - Hemoglobin A1c  4. Screening for cholesterol level  - Lipid panel  5. Screening for malignant neoplasm of colon  - Ambulatory referral to Gastroenterology  6. Encounter for screening mammogram for malignant neoplasm of breast  - MM 3D SCREEN BREAST BILATERAL; Future  7. Need for shingles vaccine  - Varicella-zoster vaccine subcutaneous  8. 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HCC)  - CBC with Differential/Platelet   -USPSTF grade A and B recommendations reviewed with patient; age-appropriate recommendations, preventive care, screening tests, etc discussed and encouraged; healthy living encouraged; see AVS for patient education given to patient -Discussed importance of 150 minutes of physical activity weekly, eat two servings of fish weekly, eat one serving of tree nuts ( cashews, pistachios, pecans, almonds.Marland Kitchen) every other day, eat 6 servings of fruit/vegetables daily and drink plenty of water and avoid sweet beverages.   -Reviewed Health Maintenance: due for mammogram, colonoscopy, immunizations

## 2022-04-26 ENCOUNTER — Other Ambulatory Visit: Payer: Self-pay

## 2022-04-26 ENCOUNTER — Ambulatory Visit (INDEPENDENT_AMBULATORY_CARE_PROVIDER_SITE_OTHER): Payer: Commercial Managed Care - PPO | Admitting: Nurse Practitioner

## 2022-04-26 ENCOUNTER — Encounter: Payer: Self-pay | Admitting: Nurse Practitioner

## 2022-04-26 VITALS — BP 118/68 | HR 90 | Temp 98.3°F | Resp 16 | Ht 61.0 in | Wt 128.1 lb

## 2022-04-26 DIAGNOSIS — Z131 Encounter for screening for diabetes mellitus: Secondary | ICD-10-CM

## 2022-04-26 DIAGNOSIS — Z0000 Encounter for general adult medical examination without abnormal findings: Secondary | ICD-10-CM

## 2022-04-26 DIAGNOSIS — Z862 Personal history of diseases of the blood and blood-forming organs and certain disorders involving the immune mechanism: Secondary | ICD-10-CM

## 2022-04-26 DIAGNOSIS — Z23 Encounter for immunization: Secondary | ICD-10-CM

## 2022-04-26 DIAGNOSIS — Z1211 Encounter for screening for malignant neoplasm of colon: Secondary | ICD-10-CM

## 2022-04-26 DIAGNOSIS — D582 Other hemoglobinopathies: Secondary | ICD-10-CM

## 2022-04-26 DIAGNOSIS — Z1231 Encounter for screening mammogram for malignant neoplasm of breast: Secondary | ICD-10-CM

## 2022-04-26 DIAGNOSIS — Z13220 Encounter for screening for lipoid disorders: Secondary | ICD-10-CM | POA: Diagnosis not present

## 2022-04-27 LAB — CBC WITH DIFFERENTIAL/PLATELET
Absolute Monocytes: 370 cells/uL (ref 200–950)
Basophils Absolute: 28 cells/uL (ref 0–200)
Basophils Relative: 0.5 %
Eosinophils Absolute: 286 cells/uL (ref 15–500)
Eosinophils Relative: 5.1 %
HCT: 40.6 % (ref 35.0–45.0)
Hemoglobin: 13.3 g/dL (ref 11.7–15.5)
Lymphs Abs: 2464 cells/uL (ref 850–3900)
MCH: 24.9 pg — ABNORMAL LOW (ref 27.0–33.0)
MCHC: 32.8 g/dL (ref 32.0–36.0)
MCV: 76 fL — ABNORMAL LOW (ref 80.0–100.0)
MPV: 10.9 fL (ref 7.5–12.5)
Monocytes Relative: 6.6 %
Neutro Abs: 2453 cells/uL (ref 1500–7800)
Neutrophils Relative %: 43.8 %
Platelets: 362 10*3/uL (ref 140–400)
RBC: 5.34 10*6/uL — ABNORMAL HIGH (ref 3.80–5.10)
RDW: 14.2 % (ref 11.0–15.0)
Total Lymphocyte: 44 %
WBC: 5.6 10*3/uL (ref 3.8–10.8)

## 2022-04-27 LAB — COMPLETE METABOLIC PANEL WITH GFR
AG Ratio: 1.4 (calc) (ref 1.0–2.5)
ALT: 25 U/L (ref 6–29)
AST: 20 U/L (ref 10–35)
Albumin: 4.5 g/dL (ref 3.6–5.1)
Alkaline phosphatase (APISO): 71 U/L (ref 37–153)
BUN: 15 mg/dL (ref 7–25)
CO2: 30 mmol/L (ref 20–32)
Calcium: 9.6 mg/dL (ref 8.6–10.4)
Chloride: 102 mmol/L (ref 98–110)
Creat: 0.71 mg/dL (ref 0.50–1.03)
Globulin: 3.2 g/dL (calc) (ref 1.9–3.7)
Glucose, Bld: 94 mg/dL (ref 65–99)
Potassium: 4.8 mmol/L (ref 3.5–5.3)
Sodium: 140 mmol/L (ref 135–146)
Total Bilirubin: 0.9 mg/dL (ref 0.2–1.2)
Total Protein: 7.7 g/dL (ref 6.1–8.1)
eGFR: 104 mL/min/{1.73_m2} (ref >=60)

## 2022-04-27 LAB — HEMOGLOBIN A1C
Hgb A1c MFr Bld: 5.9 % of total Hgb — ABNORMAL HIGH (ref <5.7)
Mean Plasma Glucose: 123 mg/dL
eAG (mmol/L): 6.8 mmol/L

## 2022-04-27 LAB — LIPID PANEL
Cholesterol: 218 mg/dL — ABNORMAL HIGH (ref <200)
HDL: 69 mg/dL (ref >=50)
LDL Cholesterol (Calc): 129 mg/dL (calc) — ABNORMAL HIGH
Non-HDL Cholesterol (Calc): 149 mg/dL (calc) — ABNORMAL HIGH (ref <130)
Total CHOL/HDL Ratio: 3.2 (calc) (ref <5.0)
Triglycerides: 97 mg/dL (ref <150)

## 2022-05-03 ENCOUNTER — Other Ambulatory Visit: Payer: Self-pay

## 2022-05-03 ENCOUNTER — Other Ambulatory Visit: Payer: Self-pay | Admitting: *Deleted

## 2022-05-03 ENCOUNTER — Telehealth: Payer: Self-pay | Admitting: *Deleted

## 2022-05-03 DIAGNOSIS — Z1211 Encounter for screening for malignant neoplasm of colon: Secondary | ICD-10-CM

## 2022-05-03 MED ORDER — NA SULFATE-K SULFATE-MG SULF 17.5-3.13-1.6 GM/177ML PO SOLN
1.0000 | Freq: Once | ORAL | 0 refills | Status: AC
  Filled 2022-05-03 – 2022-08-04 (×2): qty 354, 1d supply, fill #0

## 2022-05-03 NOTE — Telephone Encounter (Signed)
Gastroenterology Pre-Procedure Review  Request Date: 08/16/2022 Requesting Physician: Dr. Marius Ditch  PATIENT REVIEW QUESTIONS: The patient responded to the following health history questions as indicated:    1. Are you having any GI issues? no 2. Do you have a personal history of Polyps? no 3. Do you have a family history of Colon Cancer or Polyps? no 4. Diabetes Mellitus? no 5. Joint replacements in the past 12 months?no 6. Major health problems in the past 3 months?no 7. Any artificial heart valves, MVP, or defibrillator?no    MEDICATIONS & ALLERGIES:    Patient reports the following regarding taking any anticoagulation/antiplatelet therapy:   Plavix, Coumadin, Eliquis, Xarelto, Lovenox, Pradaxa, Brilinta, or Effient? no Aspirin? no  Patient confirms/reports the following medications:  Current Outpatient Medications  Medication Sig Dispense Refill   calcium carbonate 1250 MG capsule Take 1,250 mg by mouth 2 (two) times daily with a meal.     Echinacea 400 MG CAPS Take 400 mg by mouth daily.     Omega-3 Fatty Acids (FISH OIL) 1000 MG CAPS Take by mouth.     TURMERIC PO Take by mouth.     No current facility-administered medications for this visit.    Patient confirms/reports the following allergies:  No Known Allergies  No orders of the defined types were placed in this encounter.   AUTHORIZATION INFORMATION Primary Insurance: 1D#: Group #:  Secondary Insurance: 1D#: Group #:  SCHEDULE INFORMATION: Date: 08/16/2022 Time: Location: [facility]

## 2022-05-19 ENCOUNTER — Other Ambulatory Visit: Payer: Self-pay

## 2022-05-31 ENCOUNTER — Ambulatory Visit (INDEPENDENT_AMBULATORY_CARE_PROVIDER_SITE_OTHER): Payer: Commercial Managed Care - PPO

## 2022-05-31 DIAGNOSIS — Z23 Encounter for immunization: Secondary | ICD-10-CM | POA: Diagnosis not present

## 2022-05-31 NOTE — Progress Notes (Signed)
Patient presented for 2nd Shingrix vaccine. She tolerated vaccine well with no immediate side effects noted prior to departure from the clinic.

## 2022-06-06 DIAGNOSIS — Z139 Encounter for screening, unspecified: Secondary | ICD-10-CM

## 2022-06-06 LAB — GLUCOSE, POCT (MANUAL RESULT ENTRY): POC Glucose: 152 mg/dl — AB (ref 70–99)

## 2022-06-06 NOTE — Congregational Nurse Program (Signed)
  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 Yes    Student Assistance N/A    Location Patient Film/video editor, [employer]    Visit Setting with Technical sales engineer    Patient Status Unknown    Sport and exercise psychologist or [place]    Insurance/Financial Assistance Referral N/A    Medication N/A    Medical Provider Yes    Screening Referrals Made N/A    Medical Referrals Made [facility]    Medical Appointment Made N/A    Recently w/o PCP, now 1st time PCP visit completed due to CNs referral or appointment made N/A    Food N/A    Transportation N/A    Housing/Utilities N/A    Interpersonal Safety N/A    Interventions Educate;Spiritual Care    Abnormal to Normal Screening Since Last CN Visit N/A    Screenings CN Performed Blood Pressure;Blood Glucose    Sent Client to Lab for: N/A    Did client attend any of the following based off CNs referral or appointments made? N/A    ED Visit Averted N/A    Life-Saving Intervention Made N/A            client participated in health screening clinic and health ed class at [facility]. Agrees to screening and documentation in chart. At last visit the doctor told her that she was "prediabetic". Glucose screening tonight 152 non fasting. Bp 104/72. Educated re overview of diabetes; co re importance to follow up with PCP. Client verbalizes understanding. Rhermann, RN

## 2022-07-26 ENCOUNTER — Ambulatory Visit
Admission: RE | Admit: 2022-07-26 | Discharge: 2022-07-26 | Disposition: A | Discharge status: Home / Self Care | Payer: Commercial Managed Care - PPO | Source: Ambulatory Visit | Attending: Nurse Practitioner | Admitting: Nurse Practitioner

## 2022-07-26 ENCOUNTER — Encounter: Payer: Self-pay | Admitting: Radiology

## 2022-07-26 DIAGNOSIS — Z0000 Encounter for general adult medical examination without abnormal findings: Secondary | ICD-10-CM

## 2022-07-26 DIAGNOSIS — Z1231 Encounter for screening mammogram for malignant neoplasm of breast: Secondary | ICD-10-CM | POA: Insufficient documentation

## 2022-08-04 ENCOUNTER — Other Ambulatory Visit: Payer: Self-pay

## 2022-08-16 ENCOUNTER — Encounter
Admission: RE | Disposition: A | Discharge status: Home / Self Care | Payer: Self-pay | Source: Ambulatory Visit | Attending: Gastroenterology

## 2022-08-16 ENCOUNTER — Ambulatory Visit: Payer: Commercial Managed Care - PPO | Admitting: Anesthesiology

## 2022-08-16 ENCOUNTER — Encounter: Payer: Self-pay | Admitting: Gastroenterology

## 2022-08-16 ENCOUNTER — Ambulatory Visit
Admission: RE | Admit: 2022-08-16 | Discharge: 2022-08-16 | Disposition: A | Discharge status: Home / Self Care | Payer: Commercial Managed Care - PPO | Source: Ambulatory Visit | Attending: Gastroenterology | Admitting: Gastroenterology

## 2022-08-16 DIAGNOSIS — Z1211 Encounter for screening for malignant neoplasm of colon: Secondary | ICD-10-CM | POA: Diagnosis not present

## 2022-08-16 HISTORY — PX: COLONOSCOPY WITH PROPOFOL: SHX5780

## 2022-08-16 LAB — POCT PREGNANCY, URINE: Preg Test, Ur: NEGATIVE

## 2022-08-16 SURGERY — COLONOSCOPY WITH PROPOFOL
Anesthesia: General

## 2022-08-16 MED ORDER — SODIUM CHLORIDE 0.9 % IV SOLN: INTRAVENOUS | Status: DC

## 2022-08-16 MED ORDER — LIDOCAINE HCL (PF) 2 % IJ SOLN
INTRAMUSCULAR | Status: AC
  Filled 2022-08-16: qty 5

## 2022-08-16 MED ORDER — PROPOFOL 10 MG/ML IV BOLUS
INTRAVENOUS | Status: DC | PRN
  Administered 2022-08-16: 40 mg via INTRAVENOUS
  Administered 2022-08-16: 80 mg via INTRAVENOUS

## 2022-08-16 MED ORDER — PROPOFOL 1000 MG/100ML IV EMUL
INTRAVENOUS | Status: AC
  Filled 2022-08-16: qty 100

## 2022-08-16 MED ORDER — PROPOFOL 500 MG/50ML IV EMUL
INTRAVENOUS | Status: DC | PRN
  Administered 2022-08-16: 75 ug/kg/min via INTRAVENOUS

## 2022-08-16 MED ORDER — LIDOCAINE HCL (CARDIAC) PF 100 MG/5ML IV SOSY
PREFILLED_SYRINGE | INTRAVENOUS | Status: DC | PRN
  Administered 2022-08-16: 50 mg via INTRAVENOUS

## 2022-08-16 NOTE — Anesthesia Preprocedure Evaluation (Signed)
Anesthesia Evaluation  Patient identified by MRN, date of birth, ID band Patient awake    Reviewed: Allergy & Precautions, NPO status , Patient's Chart, lab work & pertinent test results  History of Anesthesia Complications Negative for: history of anesthetic complications  Airway Mallampati: II  TM Distance: >3 FB Neck ROM: Full    Dental no notable dental hx. (+) Teeth Intact   Pulmonary neg pulmonary ROS, neg sleep apnea, neg COPD, Patient abstained from smoking.Not current smoker   Pulmonary exam normal breath sounds clear to auscultation       Cardiovascular Exercise Tolerance: Good METS(-) hypertension(-) CAD and (-) Past MI negative cardio ROS (-) dysrhythmias  Rhythm:Regular Rate:Normal - Systolic murmurs    Neuro/Psych negative neurological ROS  negative psych ROS   GI/Hepatic ,neg GERD  ,,(+)     (-) substance abuse    Endo/Other  neg diabetes    Renal/GU negative Renal ROS     Musculoskeletal   Abdominal   Peds  Hematology   Anesthesia Other Findings Past Medical History: 09/09/2016: ANA positive     Comment:  1:640 homogeneous pattern; refer to rheum No date: Anemia     Comment:  "hemoglobin E" 09/08/2016: Hemoglobin E trait  Reproductive/Obstetrics                              Anesthesia Physical Anesthesia Plan  ASA: 1  Anesthesia Plan: General   Post-op Pain Management: Minimal or no pain anticipated   Induction: Intravenous  PONV Risk Score and Plan: 3 and Propofol infusion, TIVA and Ondansetron  Airway Management Planned: Nasal Cannula  Additional Equipment: None  Intra-op Plan:   Post-operative Plan:   Informed Consent: I have reviewed the patients History and Physical, chart, labs and discussed the procedure including the risks, benefits and alternatives for the proposed anesthesia with the patient or authorized representative who has indicated  his/her understanding and acceptance.     Dental advisory given  Plan Discussed with: CRNA and Surgeon  Anesthesia Plan Comments: (Discussed risks of anesthesia with patient, including possibility of difficulty with spontaneous ventilation under anesthesia necessitating airway intervention, PONV, and rare risks such as cardiac or respiratory or neurological events, and allergic reactions. Discussed the role of CRNA in patient's perioperative care. Patient understands.  Drank water at 6am; will not proceed until 8am)         Anesthesia Quick Evaluation

## 2022-08-16 NOTE — Anesthesia Postprocedure Evaluation (Signed)
Anesthesia Post Note  Patient: Bailey Perez  Procedure(s) Performed: COLONOSCOPY WITH PROPOFOL  Patient location during evaluation: Endoscopy Anesthesia Type: General Level of consciousness: awake and alert Pain management: pain level controlled Vital Signs Assessment: post-procedure vital signs reviewed and stable Respiratory status: spontaneous breathing, nonlabored ventilation, respiratory function stable and patient connected to nasal cannula oxygen Cardiovascular status: blood pressure returned to baseline and stable Postop Assessment: no apparent nausea or vomiting Anesthetic complications: no   No notable events documented.   Last Vitals:  Vitals:   08/16/22 0815 08/16/22 0833  BP: 94/68 98/75  Pulse: 71   Resp: 17   Temp: (!) 36.1 C   SpO2: 99%     Last Pain:  Vitals:   08/16/22 0833  TempSrc:   PainSc: 0-No pain                 Corinda Gubler

## 2022-08-16 NOTE — Op Note (Signed)
[facility] Patient Name: Bailey Perez Procedure Date: 08/16/2022 7:31 AM MRN: [id number] Account #: [id number] Date of Birth: 1971/04/03 Admit Type: Outpatient Age: 52 Room: [facility] ENDO ROOM 4 Gender: Female Note Status: Finalized Instrument Name: Peds Colonoscope [id number] Procedure:             Colonoscopy Indications:           Screening for colorectal malignant neoplasm, This is                         the patient's first colonoscopy Providers:             Toney Reil MD, MD Medicines:             General Anesthesia Complications:         No immediate complications. Estimated blood loss: None. Procedure:             Pre-Anesthesia Assessment:                        - Prior to the procedure, a History and Physical was                         performed, and patient medications and allergies were                         reviewed. The risks and benefits of the procedure and                         the sedation options and risks were discussed with the                         patient. All questions were answered and informed                         consent was obtained. Patient identification and                         proposed procedure were verified by the physician, the                         nurse, the anesthesiologist, the anesthetist and the                         technician in the pre-procedure area in the procedure                         room in the endoscopy suite. Mental Status                         Examination: alert and oriented. Airway Examination:                         normal oropharyngeal airway and neck mobility.                         Respiratory Examination: clear to auscultation. CV                         Examination: normal.  Prophylactic Antibiotics: The                         patient does not require prophylactic antibiotics.                         Prior Anticoagulants: The patient has taken no                          anticoagulant or antiplatelet agents. ASA Grade                         Assessment: I - A normal, healthy patient. After                         reviewing the risks and benefits, the patient was                         deemed in satisfactory condition to undergo the                         procedure. The anesthesia plan was to use general                         anesthesia. Immediately prior to administration of                         medications, the patient was re-assessed for adequacy                         to receive sedatives. The heart rate, respiratory                         rate, oxygen saturations, blood pressure, adequacy of                         pulmonary ventilation, and response to care were                         monitored throughout the procedure. The physical                         status of the patient was re-assessed after the                         procedure.                        After obtaining informed consent, the colonoscope was                         passed under direct vision. Throughout the procedure,                         the patient's blood pressure, pulse, and oxygen                         saturations were monitored continuously. The  Colonoscope was introduced through the anus and                         advanced to the the cecum, identified by appendiceal                         orifice and ileocecal valve. The colonoscopy was                         performed without difficulty. The patient tolerated                         the procedure well. The quality of the bowel                         preparation was evaluated using the BBPS [facility] Bowel                         Preparation Scale) with scores of: Right Colon = 3,                         Transverse Colon = 3 and Left Colon = 3 (entire mucosa                         seen well with no residual staining, small fragments                         of stool or  opaque liquid). The total BBPS score                         equals 9. The ileocecal valve, appendiceal orifice,                         and rectum were photographed. Findings:      The perianal and digital rectal examinations were normal. Pertinent       negatives include normal sphincter tone and no palpable rectal lesions.      The entire examined colon appeared normal.      The retroflexed view of the distal rectum and anal verge was normal and       showed no anal or rectal abnormalities. Impression:            - The entire examined colon is normal.                        - The distal rectum and anal verge are normal on                         retroflexion view.                        - No specimens collected. Recommendation:        - Discharge patient to home (with escort).                        - Resume previous diet and resume regular diet today.                        -  Continue present medications.                        - Repeat colonoscopy in 10 years for screening                         purposes. Procedure Code(s):     --- Professional ---                        N8295, Colorectal cancer screening; colonoscopy on                         individual not meeting criteria for high risk Diagnosis Code(s):     --- Professional ---                        Z12.11, Encounter for screening for malignant neoplasm                         of colon CPT copyright 2022 American Medical Association. All rights reserved. The codes documented in this report are preliminary and upon coder review may  be revised to meet current compliance requirements. Dr. Libby Maw Toney Reil MD, MD 08/16/2022 8:15:04 AM This report has been signed electronically. Number of Addenda: 0 Note Initiated On: 08/16/2022 7:31 AM Scope Withdrawal Time: 0 hours 5 minutes 53 seconds  Total Procedure Duration: 0 hours 8 minutes 2 seconds  Estimated Blood Loss:  Estimated blood loss: none.      [facility]

## 2022-08-16 NOTE — H&P (Signed)
Arlyss Repress, MD [address]  [address]  [place], [place] [address]  Main: [phone]  Fax: [phone] Pager: [phone]  Primary Care Physician:  Berniece Salines, FNP Primary Gastroenterologist:  Dr. Arlyss Repress  Pre-Procedure History & Physical: HPI:  Bailey Perez is a 52 y.o. female is here for an colonoscopy.   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 Geanie Logan    Prior to Admission medications   Medication Sig Start Date End Date Taking? Authorizing Provider  calcium carbonate 1250 MG capsule Take 1,250 mg by mouth 2 (two) times daily with a meal.   Yes [provider]  Echinacea 400 MG CAPS Take 400 mg by mouth daily.   Yes [provider]  Omega-3 Fatty Acids (FISH OIL) 1000 MG CAPS Take by mouth.   Yes [provider]  TURMERIC PO Take by mouth.   Yes [provider]    Allergies as of 05/03/2022   (No Known Allergies)    Family History  Problem Relation Age of Onset   Cancer Paternal Aunt        breast   Breast cancer Paternal Aunt    Heart disease Neg Hx     Social History   Socioeconomic History   Marital status: Married    Spouse name: amph    Number of children: 3   Years of education: 14   Highest education level: Some college, no degree  Occupational History   Not on file  Tobacco Use   Smoking status: Never   Smokeless tobacco: Never  Vaping Use   Vaping Use: Never used  Substance and Sexual Activity   Alcohol use: No    Alcohol/week: 0.0 standard drinks of alcohol   Drug use: No   Sexual activity: Yes    Partners: Male    Birth control/protection: Rhythm  Other Topics Concern   Not on file  Social History Narrative   Works at [employer] for  13 years   Social Determinants of Corporate investment banker Strain: Low Risk  (04/26/2022)   Overall Financial Resource Strain (CARDIA)    Difficulty of Paying Living Expenses: Not hard at all  Food Insecurity: No Food Insecurity (04/26/2022)   Hunger Vital Sign    Worried About Running Out of Food in the Last Year: Never true    Ran Out of Food in the Last Year: Never true  Transportation Needs: No Transportation Needs (04/26/2022)   PRAPARE - Administrator, Civil Service (Medical): No    Lack of Transportation (Non-Medical): No  Physical Activity: Unknown (04/26/2022)   Exercise Vital Sign    Days of Exercise per Week: 2 days    Minutes of Exercise per Session: Not on file  Stress: Stress Concern Present (04/26/2022)   [employer] of Occupational Health - Occupational Stress Questionnaire    Feeling of Stress : To some extent  Social Connections: Socially Integrated (04/26/2022)   Social Connection and Isolation Panel [NHANES]    Frequency of Communication with Friends and Family: More than three times a week    Frequency of Social Gatherings with Friends and Family: More than three times  a week    Attends Religious Services: 1 to 4 times per year    Active Member of Clubs or Organizations: No    Attends Banker Meetings: 1 to 4 times per year    Marital Status: Married  Catering manager Violence: Not At Risk (04/26/2022)   Humiliation, Afraid, Rape, and Kick questionnaire    Fear of Current or Ex-Partner: No    Emotionally Abused: No    Physically Abused: No    Sexually Abused: No    Review of Systems: See HPI, otherwise negative ROS  Physical Exam: BP 105/75   Pulse 78   Temp (!) 96.2 F (35.7 C) (Temporal)   Resp 15   Ht  (1.549 m)   Wt 58.4 kg   SpO2 100%   BMI 24.34 kg/m  General:   Alert,  pleasant and cooperative in NAD Head:  Normocephalic and atraumatic. Neck:  Supple; no masses or thyromegaly. Lungs:  Clear throughout to  auscultation.    Heart:  Regular rate and rhythm. Abdomen:  Soft, nontender and nondistended. Normal bowel sounds, without guarding, and without rebound.   Neurologic:  Alert and  oriented x4;  grossly normal neurologically.  Impression/Plan: Bailey Perez is here for an colonoscopy to be performed for colon cancer screening  Risks, benefits, limitations, and alternatives regarding  colonoscopy have been reviewed with the patient.  Questions have been answered.  All parties agreeable.   Lannette Donath, MD  08/16/2022, 7:53 AM

## 2022-08-16 NOTE — Transfer of Care (Signed)
Immediate Anesthesia Transfer of Care Note  Patient: Bailey Perez  Procedure(s) Performed: COLONOSCOPY WITH PROPOFOL  Patient Location: PACU and Endoscopy Unit  Anesthesia Type:General  Level of Consciousness: sedated  Airway & Oxygen Therapy: Patient Spontanous Breathing  Post-op Assessment: Report given to RN and Post -op Vital signs reviewed and stable  Post vital signs: Reviewed and stable  Last Vitals:  Vitals Value Taken Time  BP 94/68 08/16/22 0816  Temp 97   Pulse 69 08/16/22 0817  Resp 16 08/16/22 0817  SpO2 99 % 08/16/22 0817  Vitals shown include unvalidated device data.  Last Pain:  Vitals:   08/16/22 0716  TempSrc: Temporal  PainSc: 0-No pain         Complications: No notable events documented.

## 2022-08-17 ENCOUNTER — Encounter: Payer: Self-pay | Admitting: Gastroenterology

## 2023-05-01 ENCOUNTER — Ambulatory Visit (INDEPENDENT_AMBULATORY_CARE_PROVIDER_SITE_OTHER): Payer: Commercial Managed Care - PPO | Admitting: Nurse Practitioner

## 2023-05-01 ENCOUNTER — Encounter: Payer: Self-pay | Admitting: Nurse Practitioner

## 2023-05-01 VITALS — BP 114/68 | HR 93 | Temp 97.9°F | Resp 18 | Ht 61.0 in | Wt 134.5 lb

## 2023-05-01 DIAGNOSIS — Z130 Encounter for screening for diseases of the blood and blood-forming organs and certain disorders involving the immune mechanism: Secondary | ICD-10-CM | POA: Diagnosis not present

## 2023-05-01 DIAGNOSIS — Z0000 Encounter for general adult medical examination without abnormal findings: Secondary | ICD-10-CM

## 2023-05-01 DIAGNOSIS — Z131 Encounter for screening for diabetes mellitus: Secondary | ICD-10-CM | POA: Diagnosis not present

## 2023-05-01 DIAGNOSIS — E782 Mixed hyperlipidemia: Secondary | ICD-10-CM | POA: Diagnosis not present

## 2023-05-01 DIAGNOSIS — Z1231 Encounter for screening mammogram for malignant neoplasm of breast: Secondary | ICD-10-CM

## 2023-05-01 NOTE — Progress Notes (Signed)
 Name: Bailey Perez   MRN: [id number]    DOB: 1971/03/05   Date:05/01/2023       Progress Note  Subjective  Chief Complaint  Chief Complaint  Patient presents with   Annual Exam    HPI  Patient presents for annual CPE.  Diet: tries to eat well balanced Exercise: does a lot of walking at work, recommend 150 min of physical activity weekly    Sleep: 4-5 hours   Health Maintenance  Topic Date Due   DTaP/Tdap/Td vaccine (2 - Td or Tdap) 11/09/2020   Flu Shot  11/23/2022   COVID-19 Vaccine (4 - 2024-25 season) 05/17/2023*   Mammogram  07/26/2023   Pap with HPV screening  04/20/2024   Colon Cancer Screening  08/15/2032   Hepatitis C Screening  Completed   HIV Screening  Completed   Zoster (Shingles) Vaccine  Completed   HPV Vaccine  Aged Out  *Topic was postponed. The date shown is not the original due date.    Flowsheet Row Office Visit from 04/26/2022 in [facility]  AUDIT-C Score 0      Depression: Phq 9 is  negative    04/26/2022    9:37 AM 04/20/2021    9:35 AM 03/24/2020   10:14 AM 03/11/2019    9:49 AM 03/01/2018   11:42 AM  Depression screen PHQ 2/9  Decreased Interest 0 0 0 0 0  Down, Depressed, Hopeless 0 0 0 0 0  PHQ - 2 Score 0 0 0 0 0  Altered sleeping  0  0 0  Tired, decreased energy  0  0 0  Change in appetite  0  0 0  Feeling bad or failure about yourself   0  0 0  Trouble concentrating  0  0 0  Moving slowly or fidgety/restless  0  0 0  Suicidal thoughts  0  0 0  PHQ-9 Score  0  0 0  Difficult doing work/chores  Not difficult at all  Not difficult at all Not difficult at all   Hypertension: BP Readings from Last 3 Encounters:  05/01/23 114/68  08/16/22 98/75  06/06/22 104/72   Obesity: Wt Readings from Last 3 Encounters:  05/01/23 134 lb 8 oz (61 kg)  08/16/22 128 lb 12.8 oz (58.4 kg)  04/26/22 128 lb 1.6 oz (58.1 kg)   BMI Readings from Last 3 Encounters:  05/01/23 25.41 kg/m  08/16/22 24.34 kg/m   04/26/22 24.20 kg/m     Vaccines:  HPV: up to at age 67 , ask insurance if age between 60-45  Shingrix : 41-64 yo and ask insurance if covered when patient above 83 yo Pneumonia:  educated and discussed with patient. Flu:  educated and discussed with patient.  Hep C Screening: completed STD testing and prevention (HIV/chl/gon/syphilis): completed Intimate partner violence:none Sexual History : sometimes Menstrual History/LMP/Abnormal Bleeding: postmenopausal  Incontinence Symptoms: stress incontinence   Breast cancer:  - Last Mammogram: 07/26/2022 - BRCA gene screening: none  Osteoporosis: Discussed high calcium and vitamin D supplementation, weight bearing exercises  Cervical cancer screening: 04/20/2021  Skin cancer: Discussed monitoring for atypical lesions  Colorectal cancer: 08/16/2022   Lung cancer:   Low Dose CT Chest recommended if Age 66-80 years, 20 pack-year currently smoking OR have quit w/in 15years. Patient does not qualify.   ECG: 02/02/2016  Advanced Care Planning: A voluntary discussion about advance care planning including the explanation and discussion of advance directives.  Discussed health care proxy  and Living will, and the patient was able to identify a health care proxy as husband.  Patient does not have a living will at present time. If patient does have living will, I have requested they bring this to the clinic to be scanned in to their chart.  Lipids: Lab Results  Component Value Date   CHOL 218 (H) 04/26/2022   CHOL 202 (H) 04/20/2021   CHOL 173 03/24/2020   Lab Results  Component Value Date   HDL 69 04/26/2022   HDL 64 04/20/2021   HDL 58 03/24/2020   Lab Results  Component Value Date   LDLCALC 129 (H) 04/26/2022   LDLCALC 117 (H) 04/20/2021   LDLCALC 96 03/24/2020   Lab Results  Component Value Date   TRIG 97 04/26/2022   TRIG 100 04/20/2021   TRIG 93 03/24/2020   Lab Results  Component Value Date   CHOLHDL 3.2 04/26/2022    CHOLHDL 3.2 04/20/2021   CHOLHDL 3.0 03/24/2020   No results found for: LDLDIRECT  Glucose: Glucose, Bld  Date Value Ref Range Status  04/26/2022 94 65 - 99 mg/dL Final    Comment:    .            Fasting reference interval .   04/20/2021 86 65 - 99 mg/dL Final    Comment:    .            Fasting reference interval .   03/24/2020 82 65 - 99 mg/dL Final    Comment:    .            Fasting reference interval .     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 Deward Dolly    Family History  Problem Relation Age of Onset   Cancer Paternal Aunt        breast   Breast cancer Paternal Aunt    Heart disease Neg Hx     Social History   Socioeconomic History   Marital status: Married    Spouse name: amph    Number of children: 3   Years of education: 14   Highest education level: Some college, no degree  Occupational History   Not on file  Tobacco Use   Smoking status: Never   Smokeless tobacco: Never  Vaping Use   Vaping status: Never Used  Substance and Sexual Activity   Alcohol use: No    Alcohol/week: 0.0 standard drinks of alcohol   Drug use: No   Sexual  activity: Yes    Partners: Male    Birth control/protection: Rhythm  Other Topics Concern   Not on file  Social History Narrative  Works at [employer] for 13 years   Social Drivers of [employer]: Low Risk  (04/26/2022)   Overall Financial Resource Strain (CARDIA)    Difficulty of Paying Living Expenses: Not hard at all  Food Insecurity: No Food Insecurity (04/26/2022)   Hunger Vital Sign    Worried About Running Out of Food in the Last Year: Never true    Ran Out of Food in the Last Year: Never true  Transportation Needs: No Transportation Needs (04/26/2022)   PRAPARE - Administrator, Civil Service (Medical): No    Lack of Transportation (Non-Medical): No  Physical Activity: Unknown (04/26/2022)   Exercise Vital Sign    Days of Exercise per Week: 2 days    Minutes of Exercise per Session: Not on file  Stress: Stress Concern Present (04/26/2022)   [employer] of Occupational Health - Occupational Stress Questionnaire    Feeling of Stress : To some extent  Social Connections: Socially Integrated (04/26/2022)   Social Connection and Isolation Panel [NHANES]    Frequency of Communication with Friends and Family: More than three times a week    Frequency of Social Gatherings with Friends and Family: More than three times a week    Attends Religious Services: 1 to 4 times per year    Active Member of [employer] or Organizations: No    Attends Engineer, Structural: 1 to 4 times per year    Marital Status: Married  Catering Manager Violence: Not At Risk (04/26/2022)   Humiliation, Afraid, Rape, and Kick questionnaire    Fear of Current or Ex-Partner: No    Emotionally Abused: No    Physically Abused: No    Sexually Abused: No     Current Outpatient Medications:    calcium carbonate 1250 MG capsule, Take 1,250 mg by mouth 2 (two) times daily with a meal., Disp: , Rfl:    Echinacea 400 MG CAPS, Take 400 mg by mouth daily., Disp: , Rfl:    Omega-3 Fatty  Acids (FISH OIL) 1000 MG CAPS, Take by mouth., Disp: , Rfl:    TURMERIC PO, Take by mouth., Disp: , Rfl:   No Known Allergies   ROS  Constitutional: Negative for fever or weight change.  Respiratory: Negative for cough and shortness of breath.   Cardiovascular: Negative for chest pain or palpitations.  Gastrointestinal: Negative for abdominal pain, no bowel changes.  Musculoskeletal: Negative for gait problem or joint swelling.  Skin: Negative for rash.  Neurological: Negative for dizziness or headache.  No other specific complaints in a complete review of systems (except as listed in HPI above).   Objective  Vitals:   05/01/23 0845  BP: 114/68  Pulse: 93  Resp: 18  Temp: 97.9 F (36.6 C)  SpO2: 94%  Weight: 134 lb 8 oz (61 kg)  Height: 5' 1 (1.549 m)    Body mass index is 25.41 kg/m.  Physical Exam Constitutional: Patient appears well-developed and well-nourished. No distress.  HENT: Head: Normocephalic and atraumatic. Ears: B TMs ok, no erythema or effusion; Nose: Nose normal. Mouth/Throat: Oropharynx is clear and moist. No oropharyngeal exudate.  Eyes: Conjunctivae and EOM are normal. Pupils are equal, round, and reactive to light. No scleral icterus.  Neck: Normal range of motion. Neck supple. No JVD present. No thyromegaly present.  Cardiovascular: Normal rate, regular rhythm and normal heart sounds.  No murmur heard. No BLE edema. Pulmonary/Chest: Effort normal and breath sounds normal. No respiratory distress.  Abdominal: Soft. Bowel sounds are normal, no distension. There is no tenderness. no masses Breast: no lumps or masses, no nipple discharge or rashes Musculoskeletal: Normal range of motion, no joint effusions. No gross deformities Neurological: he is alert and oriented to person, place, and time. No cranial nerve deficit. Coordination, balance, strength, speech and gait are normal.  Skin: Skin is warm and dry. No rash noted. No erythema.  Psychiatric:  Patient has a normal mood and affect. behavior is normal. Judgment and thought content normal.   No results found for this or any previous visit (from the past 2160 hours).  Fall Risk:    04/26/2022    9:36 AM 04/20/2021    9:35 AM 03/24/2020   10:14 AM 03/11/2019    9:49 AM 03/01/2018   11:43 AM  Fall Risk   Falls in the past year? 0 0 0 0 0  Number falls in past yr: 0 0 0 0   Injury with Fall? 0 0 0 0   Follow up Falls evaluation completed Falls evaluation completed Falls evaluation completed Falls evaluation completed      Functional Status Survey: Is the patient deaf or have difficulty hearing?: No Does the patient have difficulty seeing, even when wearing glasses/contacts?: No Does the patient have difficulty concentrating, remembering, or making decisions?: No Does the patient have difficulty walking or climbing stairs?: No Does the patient have difficulty dressing or bathing?: No Does the patient have difficulty doing errands alone such as visiting a doctor's office or shopping?: No   Assessment & Plan  1. Annual physical exam (Primary)  - CBC with Differential/Platelet - COMPLETE METABOLIC PANEL WITH GFR - Lipid panel - Hemoglobin A1c  2. Mixed hyperlipidemia  - Lipid panel  3. Screening for diabetes mellitus  - COMPLETE METABOLIC PANEL WITH GFR - Hemoglobin A1c  4. Screening for deficiency anemia  - CBC with Differential/Platelet  5. Encounter for screening mammogram for malignant neoplasm of breast  - MM 3D SCREENING MAMMOGRAM BILATERAL BREAST; Future   -USPSTF grade A and B recommendations reviewed with patient; age-appropriate recommendations, preventive care, screening tests, etc discussed and encouraged; healthy living encouraged; see AVS for patient education given to patient -Discussed importance of 150 minutes of physical activity weekly, eat two servings of fish weekly, eat one serving of tree nuts ( cashews, pistachios, pecans, almonds.SABRA) every  other day, eat 6 servings of fruit/vegetables daily and drink plenty of water and avoid sweet beverages.   -Reviewed Health Maintenance: yes

## 2023-05-02 LAB — HEMOGLOBIN A1C
Hgb A1c MFr Bld: 5.9 %{Hb} — ABNORMAL HIGH (ref <5.7)
Mean Plasma Glucose: 123 mg/dL
eAG (mmol/L): 6.8 mmol/L

## 2023-05-02 LAB — CBC WITH DIFFERENTIAL/PLATELET
Absolute Lymphocytes: 2350 {cells}/uL (ref 850–3900)
Absolute Monocytes: 409 {cells}/uL (ref 200–950)
Basophils Absolute: 37 {cells}/uL (ref 0–200)
Basophils Relative: 0.6 %
Eosinophils Absolute: 273 {cells}/uL (ref 15–500)
Eosinophils Relative: 4.4 %
HCT: 43.2 % (ref 35.0–45.0)
Hemoglobin: 13.6 g/dL (ref 11.7–15.5)
MCH: 24.5 pg — ABNORMAL LOW (ref 27.0–33.0)
MCHC: 31.5 g/dL — ABNORMAL LOW (ref 32.0–36.0)
MCV: 77.7 fL — ABNORMAL LOW (ref 80.0–100.0)
MPV: 10.8 fL (ref 7.5–12.5)
Monocytes Relative: 6.6 %
Neutro Abs: 3131 {cells}/uL (ref 1500–7800)
Neutrophils Relative %: 50.5 %
Platelets: 363 10*3/uL (ref 140–400)
RBC: 5.56 10*6/uL — ABNORMAL HIGH (ref 3.80–5.10)
RDW: 14.9 % (ref 11.0–15.0)
Total Lymphocyte: 37.9 %
WBC: 6.2 10*3/uL (ref 3.8–10.8)

## 2023-05-02 LAB — COMPLETE METABOLIC PANEL WITH GFR
AG Ratio: 1.6 (calc) (ref 1.0–2.5)
ALT: 18 U/L (ref 6–29)
AST: 16 U/L (ref 10–35)
Albumin: 4.5 g/dL (ref 3.6–5.1)
Alkaline phosphatase (APISO): 85 U/L (ref 37–153)
BUN: 14 mg/dL (ref 7–25)
CO2: 29 mmol/L (ref 20–32)
Calcium: 9.6 mg/dL (ref 8.6–10.4)
Chloride: 103 mmol/L (ref 98–110)
Creat: 0.71 mg/dL (ref 0.50–1.03)
Globulin: 2.9 g/dL (ref 1.9–3.7)
Glucose, Bld: 91 mg/dL (ref 65–99)
Potassium: 4.6 mmol/L (ref 3.5–5.3)
Sodium: 139 mmol/L (ref 135–146)
Total Bilirubin: 0.7 mg/dL (ref 0.2–1.2)
Total Protein: 7.4 g/dL (ref 6.1–8.1)
eGFR: 103 mL/min/{1.73_m2} (ref >=60)

## 2023-05-02 LAB — LIPID PANEL
Cholesterol: 215 mg/dL — ABNORMAL HIGH (ref <200)
HDL: 75 mg/dL (ref >=50)
LDL Cholesterol (Calc): 118 mg/dL — ABNORMAL HIGH
Non-HDL Cholesterol (Calc): 140 mg/dL — ABNORMAL HIGH (ref <130)
Total CHOL/HDL Ratio: 2.9 (calc) (ref <5.0)
Triglycerides: 109 mg/dL (ref <150)

## 2024-05-06 ENCOUNTER — Other Ambulatory Visit (HOSPITAL_COMMUNITY)
Admission: RE | Admit: 2024-05-06 | Discharge: 2024-05-06 | Disposition: A | Discharge status: Home / Self Care | Source: Ambulatory Visit | Attending: Nurse Practitioner | Admitting: Nurse Practitioner

## 2024-05-06 ENCOUNTER — Encounter: Payer: Self-pay | Admitting: Nurse Practitioner

## 2024-05-06 ENCOUNTER — Ambulatory Visit (INDEPENDENT_AMBULATORY_CARE_PROVIDER_SITE_OTHER): Admitting: Nurse Practitioner

## 2024-05-06 VITALS — BP 118/86 | HR 75 | Temp 98.0°F | Ht 61.0 in | Wt 138.0 lb

## 2024-05-06 DIAGNOSIS — Z131 Encounter for screening for diabetes mellitus: Secondary | ICD-10-CM | POA: Diagnosis not present

## 2024-05-06 DIAGNOSIS — Z13220 Encounter for screening for lipoid disorders: Secondary | ICD-10-CM

## 2024-05-06 DIAGNOSIS — Z130 Encounter for screening for diseases of the blood and blood-forming organs and certain disorders involving the immune mechanism: Secondary | ICD-10-CM

## 2024-05-06 DIAGNOSIS — Z0000 Encounter for general adult medical examination without abnormal findings: Secondary | ICD-10-CM | POA: Diagnosis not present

## 2024-05-06 DIAGNOSIS — Z1231 Encounter for screening mammogram for malignant neoplasm of breast: Secondary | ICD-10-CM | POA: Diagnosis not present

## 2024-05-06 DIAGNOSIS — Z124 Encounter for screening for malignant neoplasm of cervix: Secondary | ICD-10-CM | POA: Diagnosis not present

## 2024-05-06 NOTE — Progress Notes (Addendum)
 Name: Bailey Perez   MRN: [id number]    DOB: May 31, 1970   Date:05/06/2024       Progress Note  Subjective  Chief Complaint  Chief Complaint  Patient presents with   Annual Exam    HPI  Patient presents for annual CPE. Discussed the use of AI scribe software for clinical note transcription with the patient, who gave verbal consent to proceed.  History of Present Illness Bailey Perez is a 54 year old female who presents for an annual physical exam.  Weight and nutritional status - Weight stable at 138 pounds with a four-pound fluctuation over the past year related to recent birthday celebrations - Waist circumference is 33 inches - Maintains a well-balanced diet  Physical activity - Engages in walking at work as primary form of exercise  Sleep pattern - Sleeps approximately four to five hours per night - Feels sleep duration is insufficient  Mood and depression screening - No symptoms of depression - Depression screening negative  Gynecologic and sexual health - Postmenopausal - Sexually active - Last Pap smear on April 20, 2021  Cancer and infectious disease screening - Up to date with Hepatitis C and HIV screenings - Last colorectal cancer screening on August 16, 2022    Diet: well balanced diet Exercise: walks alot  Sleep: 4-6 hours  Flowsheet Row Office Visit from 05/06/2024 in [facility]  AUDIT-C Score 0   Depression: Phq 9 is  negative    05/06/2024    9:01 AM 04/26/2022    9:37 AM 04/20/2021    9:35 AM 03/24/2020   10:14 AM 03/11/2019    9:49 AM  Depression screen PHQ 2/9  Decreased Interest 0 0 0 0 0  Down, Depressed, Hopeless 0 0 0 0 0  PHQ - 2 Score 0 0 0 0 0  Altered sleeping   0  0  Tired, decreased energy   0  0  Change in appetite   0  0  Feeling bad or failure about yourself    0  0  Trouble concentrating   0  0  Moving slowly or fidgety/restless   0  0  Suicidal thoughts   0  0  PHQ-9  Score   0   0   Difficult doing work/chores   Not difficult at all  Not difficult at all     Data saved with a previous flowsheet row definition   Hypertension: BP Readings from Last 3 Encounters:  05/06/24 118/86  05/01/23 114/68  08/16/22 98/75   Obesity: Wt Readings from Last 3 Encounters:  05/06/24 138 lb (62.6 kg)  05/01/23 134 lb 8 oz (61 kg)  08/16/22 128 lb 12.8 oz (58.4 kg)   BMI Readings from Last 3 Encounters:  05/06/24 26.07 kg/m  05/01/23 25.41 kg/m  08/16/22 24.34 kg/m    [employer] Visit from 05/06/2024 in [facility]  1 33 inches     Vaccines:  HPV: up to at age 63 , ask insurance if age between 75-45  Shingrix : 56-64 yo and ask insurance if covered when patient above 77 yo Pneumonia:  educated and discussed with patient. Flu:  educated and discussed with patient.  Hep C Screening: completed STD testing and prevention (HIV/chl/gon/syphilis): completed Intimate partner violence:none Sexual History : sexually active Menstrual History/LMP/Abnormal Bleeding: postmenopausal Incontinence Symptoms: none  Breast cancer:  - Last Mammogram: 07/26/2022 - BRCA gene screening: none  Osteoporosis: Discussed high calcium  and vitamin D supplementation, weight bearing exercises  Cervical cancer screening: 04/20/2021  Skin cancer: Discussed monitoring for atypical lesions  Colorectal cancer: 08/16/2022   Lung cancer:   Low Dose CT Chest recommended if Age 48-80 years, 20 pack-year currently smoking OR have quit w/in 15years. Patient does not qualify.   ECG: 02/02/2016  Advanced Care Planning: A voluntary discussion about advance care planning including the explanation and discussion of advance directives.  Discussed health care proxy and Living will, and the patient was able to identify a health care proxy as husband.  Patient does not have a living will at present time. If patient does have living will, I have requested they bring  this to the clinic to be scanned in to their chart.  Lipids: Lab Results  Component Value Date   CHOL 215 (H) 05/01/2023   CHOL 218 (H) 04/26/2022   CHOL 202 (H) 04/20/2021   Lab Results  Component Value Date   HDL 75 05/01/2023   HDL 69 04/26/2022   HDL 64 04/20/2021   Lab Results  Component Value Date   LDLCALC 118 (H) 05/01/2023   LDLCALC 129 (H) 04/26/2022   LDLCALC 117 (H) 04/20/2021   Lab Results  Component Value Date   TRIG 109 05/01/2023   TRIG 97 04/26/2022   TRIG 100 04/20/2021   Lab Results  Component Value Date   CHOLHDL 2.9 05/01/2023   CHOLHDL 3.2 04/26/2022   CHOLHDL 3.2 04/20/2021   No results found for: LDLDIRECT  Glucose: Glucose, Bld  Date Value Ref Range Status  05/01/2023 91 65 - 99 mg/dL Final    Comment:    .            Fasting reference interval .   04/26/2022 94 65 - 99 mg/dL Final    Comment:    .            Fasting reference interval .   04/20/2021 86 65 - 99 mg/dL Final    Comment:    .            Fasting reference interval .     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 Deward Dolly    Family History  Problem Relation Age of  Onset   Cancer Paternal Aunt        breast   Breast cancer Paternal Aunt    Heart disease Neg Hx     Social History   Socioeconomic History   Marital status: Married    Spouse name: amph    Number of children: 3   Years of education: 14   Highest education level: Some college, no degree  Occupational History  Not on file  Tobacco Use   Smoking status: Never   Smokeless tobacco: Never  Vaping Use   Vaping status: Never Used  Substance and Sexual Activity   Alcohol use: No    Alcohol/week: 0.0 standard drinks of alcohol   Drug use: No   Sexual activity: Yes    Partners: Male    Birth control/protection: Rhythm  Other Topics Concern   Not on file  Social History Narrative   Works at [employer] for 13 years   Social Drivers of Health   Tobacco Use: Low Risk (05/06/2024)   Patient History    Smoking Tobacco Use: Never    Smokeless Tobacco Use: Never    Passive Exposure: Not on file  Financial Resource Strain: Low Risk (05/06/2024)   Overall Financial Resource Strain (CARDIA)    Difficulty of Paying Living Expenses: Not hard at all  Food Insecurity: No Food Insecurity (05/06/2024)   Epic    Worried About Programme Researcher, Broadcasting/film/video in the Last Year: Never true    Ran Out of Food in the Last Year: Never true  Transportation Needs: No Transportation Needs (05/06/2024)   Epic    Lack of Transportation (Medical): No    Lack of Transportation (Non-Medical): No  Physical Activity: Inactive (05/06/2024)   Exercise Vital Sign    Days of Exercise per Week: 0 days    Minutes of Exercise per Session: 0 min  Stress: No Stress Concern Present (05/06/2024)   [employer] of Occupational Health - Occupational Stress Questionnaire    Feeling of Stress: Not at all  Social Connections: Moderately Integrated (05/06/2024)   Social Connection and Isolation Panel    Frequency of Communication with Friends and Family: More than three times a week    Frequency of Social Gatherings with Friends and  Family: More than three times a week    Attends Religious Services: 1 to 4 times per year    Active Member of [employer] or Organizations: No    Attends Banker Meetings: Never    Marital Status: Married  Catering Manager Violence: Not At Risk (05/06/2024)   Epic    Fear of Current or Ex-Partner: No    Emotionally Abused: No    Physically Abused: No    Sexually Abused: No  Depression (PHQ2-9): Low Risk (05/06/2024)   Depression (PHQ2-9)    PHQ-2 Score: 0  Alcohol Screen: Low Risk (05/06/2024)   Alcohol Screen    Last Alcohol Screening Score (AUDIT): 0  Housing: Unknown (05/06/2024)   Epic    Unable to Pay for Housing in the Last Year: No    Number of Times Moved in the Last Year: Not on file    Homeless in the Last Year: No  Utilities: Not At Risk (05/06/2024)   Epic    Threatened with loss of utilities: No  Health Literacy: Adequate Health Literacy (05/06/2024)   B1300 Health Literacy    Frequency of need for help with medical instructions: Never    Current Medications[1]  Allergies[2]   ROS  Constitutional: Negative for fever or weight change.  Respiratory: Negative for cough and shortness of breath.   Cardiovascular: Negative for chest pain or palpitations.  Gastrointestinal: Negative for abdominal pain, no bowel changes.  Musculoskeletal: Negative for gait problem or joint swelling.  Skin: Negative for rash.  Neurological: Negative for dizziness or headache.  No other specific complaints in a complete review of systems (except as listed in HPI above).   Objective  Vitals:   05/06/24 0852  BP: 118/86  Pulse: 75  Temp: 98 F (36.7 C)  SpO2: 97%  Weight: 138 lb (62.6 kg)  Height: 5' 1 (1.549 m)    Body mass index is 26.07 kg/m.  Physical Exam Vitals reviewed. Exam conducted with a chaperone present.  Constitutional:      Appearance: Normal appearance.  HENT:     Head: Normocephalic.     Right Ear: Tympanic membrane normal.     Left Ear:  Tympanic membrane normal.     Nose: Nose normal.  Eyes:     Extraocular Movements: Extraocular movements intact.     Conjunctiva/sclera: Conjunctivae normal.     Pupils: Pupils are equal, round, and reactive to light.  Neck:     Thyroid : No thyroid  mass, thyromegaly or thyroid  tenderness.  Cardiovascular:     Rate and Rhythm: Normal rate and regular rhythm.     Pulses: Normal pulses.     Heart sounds: Normal heart sounds.  Pulmonary:     Effort: Pulmonary effort is normal.     Breath sounds: Normal breath sounds.  Abdominal:     General: Bowel sounds are normal.     Palpations: Abdomen is soft.  Genitourinary:    Vagina: Normal.     Cervix: Normal.     Uterus: Normal.      Adnexa: Right adnexa normal and left adnexa normal.  Musculoskeletal:        General: Normal range of motion.     Cervical back: Normal range of motion and neck supple.     Right lower leg: No edema.     Left lower leg: No edema.  Skin:    General: Skin is warm and dry.     Capillary Refill: Capillary refill takes less than 2 seconds.  Neurological:     General: No focal deficit present.     Mental Status: She is alert and oriented to person, place, and time. Mental status is at baseline.  Psychiatric:        Mood and Affect: Mood normal.        Behavior: Behavior normal.        Thought Content: Thought content normal.        Judgment: Judgment normal.      Fall Risk:    04/26/2022    9:36 AM 04/20/2021    9:35 AM 03/24/2020   10:14 AM 03/11/2019    9:49 AM 03/01/2018   11:43 AM  Fall Risk   Falls in the past year? 0 0 0 0  0   Number falls in past yr: 0 0 0 0    Injury with Fall? 0  0  0  0    Follow up Falls evaluation completed  Falls evaluation completed  Falls evaluation completed  Falls evaluation completed       Data saved with a previous flowsheet row definition     Functional Status Survey: Is the patient deaf or have difficulty hearing?: No Does the patient have difficulty  seeing, even when wearing glasses/contacts?: No Does the patient have difficulty concentrating, remembering, or making decisions?: No Does the patient have difficulty walking or climbing stairs?: No Does the patient have difficulty dressing or bathing?: No Does the patient have difficulty doing errands alone such as visiting a doctor's office or shopping?: No   Assessment & Plan  Problem List Items Addressed This Visit       Other   Annual physical exam -  Primary   Relevant Orders   Cytology - PAP   MM 3D SCREENING MAMMOGRAM BILATERAL BREAST   CBC with Differential/Platelet   Comprehensive metabolic panel with GFR   Hemoglobin A1c   Lipid panel   Other Visit Diagnoses       Encounter for screening mammogram for malignant neoplasm of breast       Relevant Orders   MM 3D SCREENING MAMMOGRAM BILATERAL BREAST     Screening for cervical cancer       Relevant Orders   Cytology - PAP     Screening for deficiency anemia       Relevant Orders   CBC with Differential/Platelet     Screening for cholesterol level       Relevant Orders   Lipid panel     Screening for diabetes mellitus       Relevant Orders   Comprehensive metabolic panel with GFR   Hemoglobin A1c      Assessment and Plan Assessment & Plan Woman's Wellness Visit Annual wellness visit conducted. Blood pressure is well-controlled at 118/86 mmHg. Weight is stable at 138 pounds with a waist circumference of 33 inches. Depression screening is negative. Postmenopausal and sexually active. Due for Pap smear and mammogram. Healthcare proxy is her host.  - Performed Pap smear today - Provided card for mammogram appointment - Encouraged 50 minutes of physical activity weekly - Discussed importance of balanced diet    -USPSTF grade A and B recommendations reviewed with patient; age-appropriate recommendations, preventive care, screening tests, etc discussed and encouraged; healthy living encouraged; see AVS for patient  education given to patient -Discussed importance of 150 minutes of physical activity weekly, eat two servings of fish weekly, eat one serving of tree nuts ( cashews, pistachios, pecans, almonds.SABRA) every other day, eat 6 servings of fruit/vegetables daily and drink plenty of water and avoid sweet beverages.   -Reviewed Health Maintenance: yes     [1]  Current Outpatient Medications:    calcium carbonate 1250 MG capsule, Take 1,250 mg by mouth 2 (two) times daily with a meal., Disp: , Rfl:    Echinacea 400 MG CAPS, Take 400 mg by mouth daily., Disp: , Rfl:    Omega-3 Fatty Acids (FISH OIL) 1000 MG CAPS, Take by mouth., Disp: , Rfl:    TURMERIC PO, Take by mouth., Disp: , Rfl:  [2] No Known Allergies

## 2024-05-07 ENCOUNTER — Ambulatory Visit: Payer: Self-pay | Admitting: Nurse Practitioner

## 2024-05-07 LAB — CBC WITH DIFFERENTIAL/PLATELET
Absolute Lymphocytes: 3121 {cells}/uL (ref 850–3900)
Absolute Monocytes: 401 {cells}/uL (ref 200–950)
Basophils Absolute: 61 {cells}/uL (ref 0–200)
Basophils Relative: 0.9 %
Eosinophils Absolute: 272 {cells}/uL (ref 15–500)
Eosinophils Relative: 4 %
HCT: 42 % (ref 35.9–46.0)
Hemoglobin: 13.3 g/dL (ref 11.7–15.5)
MCH: 24.9 pg — ABNORMAL LOW (ref 27.0–33.0)
MCHC: 31.7 g/dL (ref 31.6–35.4)
MCV: 78.5 fL — ABNORMAL LOW (ref 81.4–101.7)
MPV: 11.5 fL (ref 7.5–12.5)
Monocytes Relative: 5.9 %
Neutro Abs: 2944 {cells}/uL (ref 1500–7800)
Neutrophils Relative %: 43.3 %
Platelets: 354 Thousand/uL (ref 140–400)
RBC: 5.35 Million/uL — ABNORMAL HIGH (ref 3.80–5.10)
RDW: 14.5 % (ref 11.0–15.0)
Total Lymphocyte: 45.9 %
WBC: 6.8 Thousand/uL (ref 3.8–10.8)

## 2024-05-07 LAB — COMPREHENSIVE METABOLIC PANEL WITH GFR
AG Ratio: 1.5 (calc) (ref 1.0–2.5)
ALT: 29 U/L (ref 6–29)
AST: 23 U/L (ref 10–35)
Albumin: 4.4 g/dL (ref 3.6–5.1)
Alkaline phosphatase (APISO): 72 U/L (ref 37–153)
BUN: 17 mg/dL (ref 7–25)
CO2: 27 mmol/L (ref 20–32)
Calcium: 9.5 mg/dL (ref 8.6–10.4)
Chloride: 104 mmol/L (ref 98–110)
Creat: 0.68 mg/dL (ref 0.50–1.03)
Globulin: 2.9 g/dL (ref 1.9–3.7)
Glucose, Bld: 101 mg/dL — ABNORMAL HIGH (ref 65–99)
Potassium: 4.5 mmol/L (ref 3.5–5.3)
Sodium: 140 mmol/L (ref 135–146)
Total Bilirubin: 0.8 mg/dL (ref 0.2–1.2)
Total Protein: 7.3 g/dL (ref 6.1–8.1)
eGFR: 104 mL/min/1.73m2

## 2024-05-07 LAB — LIPID PANEL
Cholesterol: 226 mg/dL — ABNORMAL HIGH
HDL: 68 mg/dL
LDL Cholesterol (Calc): 140 mg/dL — ABNORMAL HIGH
Non-HDL Cholesterol (Calc): 158 mg/dL — ABNORMAL HIGH
Total CHOL/HDL Ratio: 3.3 (calc)
Triglycerides: 83 mg/dL

## 2024-05-07 LAB — HEMOGLOBIN A1C
Hgb A1c MFr Bld: 5.8 % — ABNORMAL HIGH
Mean Plasma Glucose: 120 mg/dL
eAG (mmol/L): 6.6 mmol/L

## 2024-05-08 LAB — CYTOLOGY - PAP
Adequacy: ABSENT
Comment: NEGATIVE
Diagnosis: NEGATIVE
High risk HPV: NEGATIVE
